# Patient Record
Sex: Male | Born: 1955 | Race: White | Hispanic: No | Marital: Married | Smoking: Former smoker
Health system: Southern US, Community
[De-identification: ages and names within clinical notes are randomized; demographics above are authoritative.]

## PROBLEM LIST (undated history)

## (undated) DIAGNOSIS — B019 Varicella without complication: Secondary | ICD-10-CM

## (undated) DIAGNOSIS — M1990 Unspecified osteoarthritis, unspecified site: Secondary | ICD-10-CM

## (undated) DIAGNOSIS — J189 Pneumonia, unspecified organism: Secondary | ICD-10-CM

## (undated) DIAGNOSIS — B269 Mumps without complication: Secondary | ICD-10-CM

## (undated) DIAGNOSIS — C61 Malignant neoplasm of prostate: Secondary | ICD-10-CM

## (undated) DIAGNOSIS — B059 Measles without complication: Secondary | ICD-10-CM

## (undated) DIAGNOSIS — J301 Allergic rhinitis due to pollen: Secondary | ICD-10-CM

## (undated) DIAGNOSIS — F1021 Alcohol dependence, in remission: Secondary | ICD-10-CM

## (undated) DIAGNOSIS — K219 Gastro-esophageal reflux disease without esophagitis: Secondary | ICD-10-CM

## (undated) DIAGNOSIS — J45909 Unspecified asthma, uncomplicated: Secondary | ICD-10-CM

## (undated) HISTORY — DX: Allergic rhinitis due to pollen: J30.1

## (undated) HISTORY — DX: Varicella without complication: B01.9

## (undated) HISTORY — PX: OTHER SURGICAL HISTORY: SHX169

## (undated) HISTORY — PX: WISDOM TOOTH EXTRACTION: SHX21

## (undated) HISTORY — PX: TONSILLECTOMY: SUR1361

## (undated) HISTORY — DX: Alcohol dependence, in remission: F10.21

## (undated) HISTORY — DX: Unspecified asthma, uncomplicated: J45.909

## (undated) HISTORY — DX: Measles without complication: B05.9

## (undated) HISTORY — DX: Mumps without complication: B26.9

---
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
## 2013-01-28 ENCOUNTER — Encounter: Payer: Self-pay | Admitting: Physician Assistant

## 2013-01-28 ENCOUNTER — Ambulatory Visit (INDEPENDENT_AMBULATORY_CARE_PROVIDER_SITE_OTHER): Payer: Managed Care, Other (non HMO) | Admitting: Physician Assistant

## 2013-01-28 VITALS — BP 124/80 | HR 83 | Temp 98.2°F | Resp 16 | Ht 70.0 in | Wt 200.5 lb

## 2013-01-28 DIAGNOSIS — Z8669 Personal history of other diseases of the nervous system and sense organs: Secondary | ICD-10-CM

## 2013-01-28 DIAGNOSIS — Z0000 Encounter for general adult medical examination without abnormal findings: Secondary | ICD-10-CM

## 2013-01-28 DIAGNOSIS — J4520 Mild intermittent asthma, uncomplicated: Secondary | ICD-10-CM

## 2013-01-28 DIAGNOSIS — J45909 Unspecified asthma, uncomplicated: Secondary | ICD-10-CM | POA: Insufficient documentation

## 2013-01-28 LAB — CBC WITH DIFFERENTIAL/PLATELET
Basophils Absolute: 0 10*3/uL (ref 0.0–0.1)
Eosinophils Relative: 4 % (ref 0–5)
HCT: 40 % (ref 39.0–52.0)
Lymphocytes Relative: 38 % (ref 12–46)
Lymphs Abs: 2.4 10*3/uL (ref 0.7–4.0)
Monocytes Absolute: 0.6 10*3/uL (ref 0.1–1.0)
Monocytes Relative: 9 % (ref 3–12)
Neutro Abs: 3.1 10*3/uL (ref 1.7–7.7)
Platelets: 241 10*3/uL (ref 150–400)
RBC: 4.8 MIL/uL (ref 4.22–5.81)
RDW: 13.6 % (ref 11.5–15.5)
WBC: 6.3 10*3/uL (ref 4.0–10.5)

## 2013-01-28 LAB — HEPATIC FUNCTION PANEL
ALT: 20 U/L (ref 0–53)
AST: 34 U/L (ref 0–37)
Bilirubin, Direct: 0.2 mg/dL (ref 0.0–0.3)
Indirect Bilirubin: 0.8 mg/dL (ref 0.0–0.9)
Total Protein: 6.7 g/dL (ref 6.0–8.3)

## 2013-01-28 LAB — BASIC METABOLIC PANEL
Calcium: 9.7 mg/dL (ref 8.4–10.5)
Chloride: 105 mEq/L (ref 96–112)
Creat: 1 mg/dL (ref 0.50–1.35)
Potassium: 4.3 mEq/L (ref 3.5–5.3)

## 2013-01-28 LAB — HEMOGLOBIN A1C
Hgb A1c MFr Bld: 5.5 % (ref <5.7)
Mean Plasma Glucose: 111 mg/dL (ref <117)

## 2013-01-28 LAB — LIPID PANEL
Cholesterol: 191 mg/dL (ref 0–200)
LDL Cholesterol: 70 mg/dL (ref 0–99)
Total CHOL/HDL Ratio: 1.7 Ratio
VLDL: 8 mg/dL (ref 0–40)

## 2013-01-28 LAB — TSH: TSH: 2.355 u[IU]/mL (ref 0.350–4.500)

## 2013-01-28 MED ORDER — ALBUTEROL SULFATE HFA 108 (90 BASE) MCG/ACT IN AERS: 2.0000 | INHALATION_SPRAY | Freq: Four times a day (QID) | RESPIRATORY_TRACT | Status: DC | PRN

## 2013-01-28 NOTE — Progress Notes (Signed)
Patient ID: Derek Russell, male   DOB: Jun 15, 1955, 57 y.o.   MRN: [id number]  Patient presents to clinic today to establish care and for complete physical.  Acute Concerns: Patient expresses no acute concerns at today's visit.  Chronic Issues: (1) Carpal Tunnel Syndrome -- occasional flare-up for which he takes Naproxen.  Refuses surgery. (2) Asthma -- hx of mild, intermittent asthma reported by patient.  Had Rx for rescue inhaler in the past, but none currently.  Denies shortness of breath, cough or wheezing.   Health Maintenance: Dental -- up-to-date Vision -- up-to-date Colonoscopy -- 2007; no abnormal findings -- repeat in 2017 Immunizations -- up-to-date.  Declines flu shot.  Informed about Zostavax.  Past Medical History  Diagnosis Date  . Asthma   . History of alcoholism     Quit 2001  . Hay fever   . Chicken pox   . Measles   . Mumps     No current outpatient prescriptions on file prior to visit.   No current facility-administered medications on file prior to visit.    No Known Allergies  Family History  Problem Relation Age of Onset  . Alcoholism Father   . Arthritis Mother   . Arthritis Maternal Grandmother   . Alzheimer's disease Maternal Grandfather   . Lymphoma Maternal Aunt   . Lymphoma Maternal Uncle   . Healthy Sister     x2  . Healthy Son     x1    History   Social History  . Marital Status: Married    Spouse Name: N/A    Number of Children: N/A  . Years of Education: N/A   Social History Main Topics  . Smoking status: Former Smoker -- 0.50 packs/day for 5 years    Types: Cigarettes    Quit date: 01/28/1986  . Smokeless tobacco: Never Used  . Alcohol Use: No  . Drug Use: No  . Sexual Activity: Yes    Birth Control/ Protection: Pill   Other Topics Concern  . None   Social History Narrative  . None   Review of Systems  Constitutional: Negative for fever, chills, weight loss and malaise/fatigue.  HENT: Negative for ear  discharge, ear pain, hearing loss and tinnitus.   Eyes: Negative for blurred vision, double vision and photophobia.  Respiratory: Negative for cough, shortness of breath and wheezing.   Cardiovascular: Negative for chest pain and palpitations.  Gastrointestinal: Negative for heartburn, nausea, vomiting, abdominal pain, diarrhea, constipation, blood in stool and melena.  Genitourinary: Negative for dysuria, urgency, frequency, hematuria and flank pain.       Nocturia x 0-1  Musculoskeletal: Negative for myalgias.  Neurological: Negative for dizziness, seizures, loss of consciousness and headaches.  Endo/Heme/Allergies: Positive for environmental allergies.  Psychiatric/Behavioral: Negative for depression, suicidal ideas, hallucinations and substance abuse. The patient is not nervous/anxious and does not have insomnia.    Filed Vitals:   01/28/13 1435  BP: 124/80  Pulse: 83  Temp: 98.2 F (36.8 C)  Resp: 16    Physical Exam  Vitals reviewed. Constitutional: He is oriented to person, place, and time and well-developed, well-nourished, and in no distress.  HENT:  Head: Normocephalic and atraumatic.  Right Ear: External ear normal.  Left Ear: External ear normal.  Nose: Nose normal.  Mouth/Throat: Oropharynx is clear and moist. No oropharyngeal exudate.  Tympanic membranes within normal limits bilaterally.  Eyes: Conjunctivae and EOM are normal. Pupils are equal, round, and reactive to light. No scleral icterus.  Neck:  Normal range of motion. Neck supple.  Cardiovascular: Normal rate, regular rhythm, normal heart sounds and intact distal pulses.   Pulmonary/Chest: Effort normal and breath sounds normal. No respiratory distress. He has no wheezes. He has no rales. He exhibits no tenderness.  Abdominal: Soft. Bowel sounds are normal. He exhibits no distension and no mass. There is no tenderness. There is no rebound and no guarding.  Lymphadenopathy:    He has no cervical adenopathy.   Neurological: He is alert and oriented to person, place, and time. No cranial nerve deficit.  Skin: Skin is warm and dry. No rash noted.  Psychiatric: Affect normal.   Assessment/Plan: Hx of carpal tunnel syndrome Occasional flare-ups for which he uses Naproxen.  No current symptoms.  Encounter for preventive health examination Will obtain fasting labs.  Patient up-to-date on health maintenance.  Declines flu shot.  Will speak with insurance about price of Zostavax.  Asthma Rx Albuterol inhaler.

## 2013-01-28 NOTE — Assessment & Plan Note (Signed)
Will obtain fasting labs.  Patient up-to-date on health maintenance.  Declines flu shot.  Will speak with insurance about price of Zostavax.

## 2013-01-28 NOTE — Assessment & Plan Note (Signed)
Rx Albuterol inhaler.

## 2013-01-28 NOTE — Patient Instructions (Signed)
Please obtain labs.  I will call you with your results.  We will schedule follow-up if labs are abnormal.  Otherwise, return in 1 year for complete physical and sooner if/when you need [place].

## 2013-01-28 NOTE — Assessment & Plan Note (Signed)
Occasional flare-ups for which he uses Naproxen.  No current symptoms.

## 2013-01-29 LAB — URINALYSIS, ROUTINE W REFLEX MICROSCOPIC
Hgb urine dipstick: NEGATIVE
Ketones, ur: NEGATIVE mg/dL
Nitrite: NEGATIVE
Urobilinogen, UA: 0.2 mg/dL (ref 0.0–1.0)
pH: 6 (ref 5.0–8.0)

## 2013-01-29 LAB — PSA: PSA: 3.81 ng/mL (ref <=4.00)

## 2013-10-10 ENCOUNTER — Encounter (HOSPITAL_COMMUNITY): Payer: Self-pay | Admitting: Emergency Medicine

## 2013-10-10 ENCOUNTER — Emergency Department (HOSPITAL_COMMUNITY)
Admission: EM | Admit: 2013-10-10 | Discharge: 2013-10-10 | Disposition: A | Discharge status: Home / Self Care | Payer: Managed Care, Other (non HMO) | Attending: Emergency Medicine | Admitting: Emergency Medicine

## 2013-10-10 ENCOUNTER — Emergency Department (HOSPITAL_COMMUNITY): Payer: Managed Care, Other (non HMO)

## 2013-10-10 DIAGNOSIS — S46909A Unspecified injury of unspecified muscle, fascia and tendon at shoulder and upper arm level, unspecified arm, initial encounter: Secondary | ICD-10-CM | POA: Insufficient documentation

## 2013-10-10 DIAGNOSIS — Z79899 Other long term (current) drug therapy: Secondary | ICD-10-CM | POA: Insufficient documentation

## 2013-10-10 DIAGNOSIS — Z7982 Long term (current) use of aspirin: Secondary | ICD-10-CM | POA: Insufficient documentation

## 2013-10-10 DIAGNOSIS — F1021 Alcohol dependence, in remission: Secondary | ICD-10-CM | POA: Diagnosis not present

## 2013-10-10 DIAGNOSIS — IMO0002 Reserved for concepts with insufficient information to code with codable children: Secondary | ICD-10-CM | POA: Diagnosis not present

## 2013-10-10 DIAGNOSIS — J45909 Unspecified asthma, uncomplicated: Secondary | ICD-10-CM | POA: Insufficient documentation

## 2013-10-10 DIAGNOSIS — Z8619 Personal history of other infectious and parasitic diseases: Secondary | ICD-10-CM | POA: Diagnosis not present

## 2013-10-10 DIAGNOSIS — Y92410 Unspecified street and highway as the place of occurrence of the external cause: Secondary | ICD-10-CM | POA: Diagnosis not present

## 2013-10-10 DIAGNOSIS — S298XXA Other specified injuries of thorax, initial encounter: Secondary | ICD-10-CM | POA: Insufficient documentation

## 2013-10-10 DIAGNOSIS — Y9389 Activity, other specified: Secondary | ICD-10-CM | POA: Diagnosis not present

## 2013-10-10 DIAGNOSIS — S4980XA Other specified injuries of shoulder and upper arm, unspecified arm, initial encounter: Secondary | ICD-10-CM | POA: Diagnosis not present

## 2013-10-10 DIAGNOSIS — Z23 Encounter for immunization: Secondary | ICD-10-CM | POA: Diagnosis not present

## 2013-10-10 DIAGNOSIS — Z87891 Personal history of nicotine dependence: Secondary | ICD-10-CM | POA: Insufficient documentation

## 2013-10-10 MED ORDER — TETANUS-DIPHTH-ACELL PERTUSSIS 5-2.5-18.5 LF-MCG/0.5 IM SUSP
0.5000 mL | Freq: Once | INTRAMUSCULAR | Status: AC
Start: 2013-10-10 — End: 2013-10-10
  Administered 2013-10-10: 0.5 mL via INTRAMUSCULAR
  Filled 2013-10-10: qty 0.5

## 2013-10-10 MED ORDER — METHOCARBAMOL 500 MG PO TABS: 500.0000 mg | ORAL_TABLET | Freq: Two times a day (BID) | ORAL | Status: DC

## 2013-10-10 NOTE — ED Notes (Signed)
He ambulates and dresses himself without difficulty.

## 2013-10-10 NOTE — ED Notes (Signed)
Patient transported to X-ray 
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
## 2013-10-10 NOTE — ED Provider Notes (Signed)
Medical screening examination/treatment/procedure(s) were performed by non-physician practitioner and as supervising physician I was immediately available for consultation/collaboration.   EKG Interpretation None       Kalman Drape, MD 10/10/13 1411
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
## 2013-10-11 ENCOUNTER — Emergency Department (HOSPITAL_COMMUNITY)
Admission: EM | Admit: 2013-10-11 | Discharge: 2013-10-11 | Disposition: A | Discharge status: Home / Self Care | Payer: Managed Care, Other (non HMO) | Attending: Emergency Medicine | Admitting: Emergency Medicine

## 2013-10-11 ENCOUNTER — Encounter (HOSPITAL_COMMUNITY): Payer: Self-pay | Admitting: Emergency Medicine

## 2013-10-11 DIAGNOSIS — B019 Varicella without complication: Secondary | ICD-10-CM | POA: Insufficient documentation

## 2013-10-11 DIAGNOSIS — Z87891 Personal history of nicotine dependence: Secondary | ICD-10-CM | POA: Insufficient documentation

## 2013-10-11 DIAGNOSIS — F1021 Alcohol dependence, in remission: Secondary | ICD-10-CM | POA: Insufficient documentation

## 2013-10-11 DIAGNOSIS — J45909 Unspecified asthma, uncomplicated: Secondary | ICD-10-CM | POA: Insufficient documentation

## 2013-10-11 DIAGNOSIS — S20212D Contusion of left front wall of thorax, subsequent encounter: Secondary | ICD-10-CM

## 2013-10-11 DIAGNOSIS — Z7982 Long term (current) use of aspirin: Secondary | ICD-10-CM | POA: Insufficient documentation

## 2013-10-11 DIAGNOSIS — B059 Measles without complication: Secondary | ICD-10-CM | POA: Insufficient documentation

## 2013-10-11 DIAGNOSIS — B269 Mumps without complication: Secondary | ICD-10-CM | POA: Insufficient documentation

## 2013-10-11 DIAGNOSIS — Z79899 Other long term (current) drug therapy: Secondary | ICD-10-CM | POA: Insufficient documentation

## 2013-10-11 DIAGNOSIS — S20219A Contusion of unspecified front wall of thorax, initial encounter: Secondary | ICD-10-CM | POA: Insufficient documentation

## 2013-10-11 DIAGNOSIS — J309 Allergic rhinitis, unspecified: Secondary | ICD-10-CM | POA: Insufficient documentation

## 2013-10-11 MED ORDER — HYDROCODONE-ACETAMINOPHEN 5-325 MG PO TABS: 1.0000 | ORAL_TABLET | Freq: Four times a day (QID) | ORAL | Status: DC | PRN

## 2013-10-11 NOTE — Discharge Instructions (Signed)
Return here as needed.  Followup with your primary care doctor use ice and heat to the area that is sore
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
## 2013-10-11 NOTE — ED Provider Notes (Signed)
Medical screening examination/treatment/procedure(s) were performed by non-physician practitioner and as supervising physician I was immediately available for consultation/collaboration.   EKG Interpretation None       Virgel Manifold, MD 10/11/13 1011
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
## 2013-10-15 ENCOUNTER — Telehealth (HOSPITAL_BASED_OUTPATIENT_CLINIC_OR_DEPARTMENT_OTHER): Payer: Self-pay | Admitting: Emergency Medicine

## 2013-10-25 ENCOUNTER — Ambulatory Visit (INDEPENDENT_AMBULATORY_CARE_PROVIDER_SITE_OTHER): Payer: Managed Care, Other (non HMO) | Admitting: Physician Assistant

## 2013-10-25 ENCOUNTER — Encounter: Payer: Self-pay | Admitting: Physician Assistant

## 2013-10-25 DIAGNOSIS — S20219A Contusion of unspecified front wall of thorax, initial encounter: Secondary | ICD-10-CM

## 2013-10-25 LAB — CBC
HCT: 40.4 % (ref 39.0–52.0)
HEMOGLOBIN: 13.8 g/dL (ref 13.0–17.0)
MCHC: 34.2 g/dL (ref 30.0–36.0)
MCV: 89.6 fl (ref 78.0–100.0)
PLATELETS: 260 10*3/uL (ref 150.0–400.0)
RBC: 4.51 Mil/uL (ref 4.22–5.81)
RDW: 12.9 % (ref 11.5–15.5)
WBC: 7.3 10*3/uL (ref 4.0–10.5)

## 2013-10-25 LAB — COMPREHENSIVE METABOLIC PANEL
ALK PHOS: 70 U/L (ref 39–117)
ALT: 18 U/L (ref 0–53)
AST: 29 U/L (ref 0–37)
Albumin: 4.1 g/dL (ref 3.5–5.2)
BUN: 20 mg/dL (ref 6–23)
CO2: 30 mEq/L (ref 19–32)
CREATININE: 0.9 mg/dL (ref 0.4–1.5)
Calcium: 9.3 mg/dL (ref 8.4–10.5)
Chloride: 103 mEq/L (ref 96–112)
GFR: 87.5 mL/min (ref >60.00)
Glucose, Bld: 101 mg/dL — ABNORMAL HIGH (ref 70–99)
Potassium: 3.7 mEq/L (ref 3.5–5.1)
Sodium: 139 mEq/L (ref 135–145)
Total Bilirubin: 0.8 mg/dL (ref 0.2–1.2)
Total Protein: 6.7 g/dL (ref 6.0–8.3)

## 2013-10-25 NOTE — Patient Instructions (Signed)
Please obtain labs.  I will call you with your results.  Continue over-the-counter pain medications as needed.  You will be contacted by our [facility] office to schedule your CT scan.  Keep your cell phone on.  If you develop any lightheadedness or dizziness, please proceed to the ER.
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
## 2013-10-25 NOTE — Progress Notes (Signed)
Pre visit review using our clinic review tool, if applicable. No additional management support is needed unless otherwise documented below in the visit note/SLS  

## 2013-10-26 ENCOUNTER — Ambulatory Visit (HOSPITAL_BASED_OUTPATIENT_CLINIC_OR_DEPARTMENT_OTHER): Payer: Managed Care, Other (non HMO)

## 2014-02-02 ENCOUNTER — Telehealth: Payer: Self-pay | Admitting: Physician Assistant

## 2014-02-02 DIAGNOSIS — Z1211 Encounter for screening for malignant neoplasm of colon: Secondary | ICD-10-CM

## 2014-02-02 NOTE — Telephone Encounter (Signed)
Referral placed.  Will be taken care of.

## 2014-02-02 NOTE — Telephone Encounter (Signed)
Pt states it is time for him to get a colonoscopy and he is needing a referral, pt has [place]. Pt perfer someone in [place] if possible.

## 2014-09-09 ENCOUNTER — Ambulatory Visit (INDEPENDENT_AMBULATORY_CARE_PROVIDER_SITE_OTHER): Payer: Managed Care, Other (non HMO) | Admitting: Physician Assistant

## 2014-09-09 ENCOUNTER — Encounter: Payer: Self-pay | Admitting: Physician Assistant

## 2014-09-09 VITALS — BP 136/82 | HR 88 | Temp 97.6°F | Resp 16 | Ht 70.0 in | Wt 188.2 lb

## 2014-09-09 DIAGNOSIS — J019 Acute sinusitis, unspecified: Secondary | ICD-10-CM

## 2014-09-09 DIAGNOSIS — B9689 Other specified bacterial agents as the cause of diseases classified elsewhere: Secondary | ICD-10-CM | POA: Insufficient documentation

## 2014-09-09 MED ORDER — DOXYCYCLINE HYCLATE 100 MG PO CAPS: 100.0000 mg | ORAL_CAPSULE | Freq: Two times a day (BID) | ORAL | Status: DC

## 2014-09-09 NOTE — Assessment & Plan Note (Signed)
Rx Doxycycline.  Increase fluids.  Rest.  Saline nasal spray.  Probiotic.  Mucinex as directed.  Humidifier in bedroom.  Call or return to clinic if symptoms are not improving.  

## 2014-09-09 NOTE — Patient Instructions (Signed)
Please take antibiotic as directed.  Increase fluid intake.  Use Saline nasal spray.  Take a daily multivitamin. Use Mucinex-D for congestion.  Place a humidifier in the bedroom.  Please call or return clinic if symptoms are not improving.  Sinusitis Sinusitis is redness, soreness, and swelling (inflammation) of the paranasal sinuses. Paranasal sinuses are air pockets within the bones of your face (beneath the eyes, the middle of the forehead, or above the eyes). In healthy paranasal sinuses, mucus is able to drain out, and air is able to circulate through them by way of your nose. However, when your paranasal sinuses are inflamed, mucus and air can become trapped. This can allow bacteria and other germs to grow and cause infection. Sinusitis can develop quickly and last only a short time (acute) or continue over a long period (chronic). Sinusitis that lasts for more than 12 weeks is considered chronic.  CAUSES  Causes of sinusitis include:  Allergies.  Structural abnormalities, such as displacement of the cartilage that separates your nostrils (deviated septum), which can decrease the air flow through your nose and sinuses and affect sinus drainage.  Functional abnormalities, such as when the small hairs (cilia) that line your sinuses and help remove mucus do not work properly or are not present. SYMPTOMS  Symptoms of acute and chronic sinusitis are the same. The primary symptoms are pain and pressure around the affected sinuses. Other symptoms include:  Upper toothache.  Earache.  Headache.  Bad breath.  Decreased sense of smell and taste.  A cough, which worsens when you are lying flat.  Fatigue.  Fever.  Thick drainage from your nose, which often is green and may contain pus (purulent).  Swelling and warmth over the affected sinuses. DIAGNOSIS  Your caregiver will perform a physical exam. During the exam, your caregiver may:  Look in your nose for signs of abnormal growths  in your nostrils (nasal polyps).  Tap over the affected sinus to check for signs of infection.  View the inside of your sinuses (endoscopy) with a special imaging device with a light attached (endoscope), which is inserted into your sinuses. If your caregiver suspects that you have chronic sinusitis, one or more of the following tests may be recommended:  Allergy tests.  Nasal culture A sample of mucus is taken from your nose and sent to a lab and screened for bacteria.  Nasal cytology A sample of mucus is taken from your nose and examined by your caregiver to determine if your sinusitis is related to an allergy. TREATMENT  Most cases of acute sinusitis are related to a viral infection and will resolve on their own within 10 days. Sometimes medicines are prescribed to help relieve symptoms (pain medicine, decongestants, nasal steroid sprays, or saline sprays).  However, for sinusitis related to a bacterial infection, your caregiver will prescribe antibiotic medicines. These are medicines that will help kill the bacteria causing the infection.  Rarely, sinusitis is caused by a fungal infection. In theses cases, your caregiver will prescribe antifungal medicine. For some cases of chronic sinusitis, surgery is needed. Generally, these are cases in which sinusitis recurs more than 3 times per year, despite other treatments. HOME CARE INSTRUCTIONS   Drink plenty of water. Water helps thin the mucus so your sinuses can drain more easily.  Use a humidifier.  Inhale steam 3 to 4 times a day (for example, sit in the bathroom with the shower running).  Apply a warm, moist washcloth to your face 3 to  4 times a day, or as directed by your caregiver.  Use saline nasal sprays to help moisten and clean your sinuses.  Take over-the-counter or prescription medicines for pain, discomfort, or fever only as directed by your caregiver. SEEK IMMEDIATE MEDICAL CARE IF:  You have increasing pain or severe  headaches.  You have nausea, vomiting, or drowsiness.  You have swelling around your face.  You have vision problems.  You have a stiff neck.  You have difficulty breathing. MAKE SURE YOU:   Understand these instructions.  Will watch your condition.  Will get help right away if you are not doing well or get worse. Document Released: 03/18/2005 Document Revised: 06/10/2011 Document Reviewed: 04/02/2011 [facility] Patient Information 2014 [place], [place].

## 2014-09-09 NOTE — Progress Notes (Signed)
Patient presents to clinic today c/o 1 month of sinus pressure, sinus pain, ear pressure, PND with intermittent sore throat, and cough that is now productive of yellow-brown sputum.  Patient endorses mild fatigue and chest congestion.  Denies fevers, chest pain or shortness of breath. Denies recent travel or sick contact.  Past Medical History  Diagnosis Date  . Asthma   . History of alcoholism     Quit 2001  . Hay fever   . Chicken pox   . Measles   . Mumps     Current Outpatient Prescriptions on File Prior to Visit  Medication Sig Dispense Refill  . aspirin 81 MG tablet Take 162 mg by mouth every morning.     . B Complex-C (B-COMPLEX WITH VITAMIN C) tablet Take 1 tablet by mouth every morning.     . Multiple Vitamins-Minerals (ANTIOXIDANT FORMULA) TABS Take 1 each by mouth daily.    . naproxen sodium (ANAPROX) 220 MG tablet Take 220 mg by mouth 2 (two) times daily as needed.      No current facility-administered medications on file prior to visit.    No Known Allergies  Family History  Problem Relation Age of Onset  . Alcoholism Father   . Arthritis Mother   . Arthritis Maternal Grandmother   . Alzheimer's disease Maternal Grandfather   . Lymphoma Maternal Aunt   . Lymphoma Maternal Uncle   . Healthy Sister     x2  . Healthy Son     x1    History   Social History  . Marital Status: Married    Spouse Name: N/A  . Number of Children: N/A  . Years of Education: N/A   Social History Main Topics  . Smoking status: Former Smoker -- 0.50 packs/day for 5 years    Types: Cigarettes    Quit date: 01/28/1986  . Smokeless tobacco: Never Used  . Alcohol Use: No  . Drug Use: No  . Sexual Activity: Yes    Birth Control/ Protection: Pill   Other Topics Concern  . None   Social History Narrative   Review of Systems - See HPI.  All other ROS are negative.  BP 136/82 mmHg  Pulse 88  Temp(Src) 97.6 F (36.4 C) (Oral)  Resp 16  Ht 5\' 10"  (1.778 m)  Wt 188 lb 4  oz (85.39 kg)  BMI 27.01 kg/m2  SpO2 100%  Physical Exam  Constitutional: He is oriented to person, place, and time and well-developed, well-nourished, and in no distress.  HENT:  Head: Normocephalic and atraumatic.  Right Ear: External ear normal.  Left Ear: External ear normal.  Nose: Mucosal edema and rhinorrhea present. Right sinus exhibits frontal sinus tenderness. Left sinus exhibits frontal sinus tenderness.  Mouth/Throat: Uvula is midline, oropharynx is clear and moist and mucous membranes are normal. No oropharyngeal exudate.  Eyes: Conjunctivae are normal. Pupils are equal, round, and reactive to light.  Neck: Neck supple.  Cardiovascular: Normal rate, regular rhythm, normal heart sounds and intact distal pulses.   Pulmonary/Chest: Effort normal and breath sounds normal. No respiratory distress. He has no wheezes. He has no rales. He exhibits no tenderness.  Lymphadenopathy:    He has no cervical adenopathy.  Neurological: He is alert and oriented to person, place, and time.  Skin: Skin is warm and dry. No rash noted.  Psychiatric: Affect normal.  Vitals reviewed.  Assessment/Plan: Acute bacterial sinusitis Rx Doxycycline.  Increase fluids.  Rest.  Saline nasal spray.  Probiotic.  Mucinex as directed.  Humidifier in bedroom.  Call or return to clinic if symptoms are not improving.

## 2014-09-09 NOTE — Progress Notes (Signed)
Pre visit review using our clinic review tool, if applicable. No additional management support is needed unless otherwise documented below in the visit note/SLS  

## 2014-09-20 ENCOUNTER — Telehealth: Payer: Self-pay | Admitting: *Deleted

## 2014-09-20 NOTE — Telephone Encounter (Signed)
ROI received via fax from Rachel. Forwarded to [place] to scan/email to medical records. JG/CMA

## 2015-04-02 HISTORY — PX: SHOULDER SURGERY: SHX246

## 2016-05-28 ENCOUNTER — Telehealth: Payer: Self-pay | Admitting: Medical Oncology

## 2016-05-28 ENCOUNTER — Encounter: Payer: Self-pay | Admitting: Medical Oncology

## 2016-05-28 NOTE — Progress Notes (Signed)
Dreama-wife left a voicemail stating that her husband Derek Russell would like to get an appointment in the Prostate [place] for a 2nd opinion. She asked I called her husband.

## 2016-05-28 NOTE — Progress Notes (Signed)
I left a message with Derek Russell to inform him I received a message from his wife regarding an appointment for a 2nd opinion. I introduced myself and left my office number and requested he call me back so I could discuss the Prostate [facility].

## 2016-05-28 NOTE — Progress Notes (Signed)
I spoke with Derek Russell and discussed the Prostate [place]. He is very interested and would like for set up an appointment. He was seen at [facility] and had a prostate biopsy by Dr. Dion Body. I verified his address and will mail him a packet of information. I will also e-mail him a release of information form.

## 2016-06-03 ENCOUNTER — Encounter: Payer: Self-pay | Admitting: Genetic Counselor

## 2016-06-03 ENCOUNTER — Telehealth: Payer: Self-pay | Admitting: Medical Oncology

## 2016-06-03 NOTE — Progress Notes (Signed)
Called [facility]. [facility] to inquire about pathology slides. I faxed a release of information to [phone]

## 2016-06-03 NOTE — Progress Notes (Signed)
I called pt to introduce myself as the Prostate Nurse Navigator and the Coordinator of the Prostate [place].  1. I gave him an appointment for March 09,2018 arriving at 7:45am/   2. I discussed the format of the clinic and the physicians he will be seeing that day.  3. I discussed where the clinic is located and how to contact me.  4. I confirmed his address and informed him I would be mailing a packet of information and forms to be completed. I asked him to bring them with him the day of his appointment.   He voiced understanding of the above. I asked him to call me if he has any questions or concerns regarding his appointments or the forms he needs to complete.

## 2016-06-03 NOTE — Progress Notes (Signed)
Derek Russell has not received the packet of information that I mailed him last Thursday. He asked to clarify the date, time and address for the conference. I explained that the packet went thru hospital mail room and it takes a few extra days. All questions were answered.

## 2016-06-03 NOTE — Progress Notes (Signed)
I spoke with [facility] medical records and faxed a release of information to obtain records from Dr. Wille Glaser Hudson's office visit.

## 2016-06-04 ENCOUNTER — Telehealth: Payer: Self-pay | Admitting: Medical Oncology

## 2016-06-04 NOTE — Progress Notes (Signed)
I called [facility] records to inquire about the status of my request. I spoke with Shirlean Mylar and she states that the original release of information 3/01 had been scanned. She will place a request to have records faxed today. I explained he is scheduled for clinic Friday and if I do not receive the patient will have to be rescheduled.

## 2016-06-06 ENCOUNTER — Telehealth: Payer: Self-pay | Admitting: Medical Oncology

## 2016-06-06 ENCOUNTER — Encounter: Payer: Self-pay | Admitting: Radiation Oncology

## 2016-06-06 ENCOUNTER — Encounter: Payer: Self-pay | Admitting: Medical Oncology

## 2016-06-06 NOTE — Progress Notes (Signed)
Received a call from [facility] pathology to notify me that the pathology slides have not arrived from [facility] for prostate [facility] 06/07/16. I will follow up

## 2016-06-06 NOTE — Progress Notes (Signed)
I had called Rhonda with tracking number and she followed up with [employer]. The slides were not shipped on Monday but yesterday. They should arrive at [facility] today around 1 pm. She will keep me updated.

## 2016-06-06 NOTE — Progress Notes (Signed)
I called [facility] pathology department to check on status of prostate pathology slides. I was informed that they were sent by [employer] on Monday. I was given the tracking number and will follow up with [employer].

## 2016-06-06 NOTE — Progress Notes (Signed)
GU Location of Tumor / Histology: prostatic adenocarcinoma  If Prostate Cancer, Gleason Score is (3 + 3) and PSA is (7.5)  Melanee Spry previously had his PSA checked in 2014. PSA in 2014 was 3.8. Patient didn't have PSA check again until 12/2015 and it proved to be 7.5  Biopsies of prostate (if applicable) revealed:    Past/Anticipated interventions by urology, if any: biopsy, referral for a second opinion  Past/Anticipated interventions by medical oncology, if any: no  Weight changes, if any: no  Bowel/Bladder complaints, if any: mild ED and nocturia   Nausea/Vomiting, if any: no  Pain issues, if any:  no  SAFETY ISSUES:  Prior radiation? no  Pacemaker/ICD? no  Possible current pregnancy? no  Is the patient on methotrexate? no  Current Complaints / other details:  61 year old male.Married to, [place]. 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 One son, Vonna Kotyk, who lives in [place].   Had a colonoscopy in 2017

## 2016-06-06 NOTE — Progress Notes (Signed)
Confirmed appointment for Prostate [facility] 06/07/16 arriving at 7:45am. I reminded him to bring his completed medical forms. I also asked him to bring his insurance card and to update his address when he registers. He voiced understanding.

## 2016-06-06 NOTE — Progress Notes (Signed)
Notified by Suanne Marker [facility], prostate biopsy slides from [place] arrived.

## 2016-06-07 ENCOUNTER — Encounter: Payer: Self-pay | Admitting: General Practice

## 2016-06-07 ENCOUNTER — Encounter: Payer: Self-pay | Admitting: Medical Oncology

## 2016-06-07 ENCOUNTER — Ambulatory Visit (HOSPITAL_BASED_OUTPATIENT_CLINIC_OR_DEPARTMENT_OTHER): Payer: Managed Care, Other (non HMO) | Admitting: Oncology

## 2016-06-07 ENCOUNTER — Ambulatory Visit
Admission: RE | Admit: 2016-06-07 | Discharge: 2016-06-07 | Disposition: A | Discharge status: Home / Self Care | Payer: Managed Care, Other (non HMO) | Source: Ambulatory Visit | Attending: Radiation Oncology | Admitting: Radiation Oncology

## 2016-06-07 ENCOUNTER — Encounter: Payer: Self-pay | Admitting: Radiation Oncology

## 2016-06-07 DIAGNOSIS — Z7982 Long term (current) use of aspirin: Secondary | ICD-10-CM | POA: Insufficient documentation

## 2016-06-07 DIAGNOSIS — Z807 Family history of other malignant neoplasms of lymphoid, hematopoietic and related tissues: Secondary | ICD-10-CM | POA: Diagnosis not present

## 2016-06-07 DIAGNOSIS — Z8261 Family history of arthritis: Secondary | ICD-10-CM | POA: Diagnosis not present

## 2016-06-07 DIAGNOSIS — Z79899 Other long term (current) drug therapy: Secondary | ICD-10-CM | POA: Insufficient documentation

## 2016-06-07 DIAGNOSIS — Z87891 Personal history of nicotine dependence: Secondary | ICD-10-CM | POA: Insufficient documentation

## 2016-06-07 DIAGNOSIS — Z808 Family history of malignant neoplasm of other organs or systems: Secondary | ICD-10-CM | POA: Diagnosis not present

## 2016-06-07 DIAGNOSIS — C61 Malignant neoplasm of prostate: Secondary | ICD-10-CM | POA: Insufficient documentation

## 2016-06-07 HISTORY — DX: Malignant neoplasm of prostate: C61

## 2016-06-07 NOTE — Progress Notes (Signed)
[place] Psychosocial Distress Screening Spiritual Care  Met with Derek Russell and [name] in [facility] to introduce [place] team/resources, reviewing distress screen per protocol.  The patient scored a 4 on the Psychosocial Distress Thermometer which indicates moderate distress. Also assessed for distress and other psychosocial needs.   [facility] DISTRESS SCREENING 06/07/2016  Screening Type Initial Screening  Distress experienced in past week (1-10) 4  Practical problem type Work/school  Emotional problem type Adjusting to illness  Information Concerns Type Lack of info about treatment  Referral to support programs Yes   [name] report a spike in emotional distress from dx, as well as significant reduction via the kind and thorough care of all providers at [facility].  Normalized feelings, provided empathic listening, and introduced [place] team/programming.  Follow up needed: No. Per pt, no other needs at this time.  Couple has full packet of [place] resources and knows how to reach the team. Please also page if immediate needs arise.  Thank you.   [place], [place], [facility] Pager [phone] Voicemail [phone]

## 2016-06-07 NOTE — Consult Note (Signed)
[facility]     06/07/2016   --------------------------------------------------------------------------------   Melanee Spry  MRN: [id number]  PRIMARY CARE:    DOB: 05-01-1955, 61 year old Male  REFERRING:    SSN:   PROVIDER:  [name], M.D.    LOCATION:  [facility] - [phone]   --------------------------------------------------------------------------------   CC/HPI: CC: Prostate Cancer   PCP: [name], DO  Location of consult: [facility]  Primary urologist: Dr. Peterson Lombard   Mr. Strike is a 61 year old who was noted to have an elevated PSA of 7.2 prompting a TRUS biopsy of the prostate by Dr. Peterson Lombard on 04/19/16. This confirmed Gleason 3+3=6 adenocarcinoma of the prostate in 4 out of 12 biopsy cores.   Family history: None.   Imaging studies: None.   PMH: He no major medical cormobidities.  PSH: No abdominal surgeries.   TNM stage: cT1c Nx Mx  PSA: 7.2  Gleason score: 3+3=6  Biopsy (04/19/16 - reviewed here by Dr. Orene Desanctis): 4/12 cores positive  Left: Left base (5%, 30%)  Right: R base (80%, 80%)  Prostate volume: 38 cc  PSAD: 0.19   Nomogram  OC disease: 62%  EPE: 37%  SVI: 1%  LNI: 1%  PFS (5 year, 10 year): 94%,89%   Urinary function: IPSS is 5.  Erectile function: SHIM score is 24.     ALLERGIES: None   MEDICATIONS: Aspirin 81 mg tablet, chewable  Naproxen 1 PO Daily  Red Yeast Rice 1 PO Daily  Vitamin B     GU PSH: None   NON-GU PSH: Lung lobectomy Shoulder Surgery (Unspecified)    GU PMH: None   NON-GU PMH: None   FAMILY HISTORY: lymphoma - Aunt melanoma - Uncle Prostate Cancer - No Family History   SOCIAL HISTORY: Marital Status: Married Current Smoking Status: Patient has never smoked.   Tobacco Use Assessment Completed: Used Tobacco in last 30 days?     Notes: Works as Freight forwarder at [employer]:    GU Review  Male:   Patient denies frequent urination, hard to postpone urination, burning/ pain with urination, get up at night to urinate, leakage of urine, stream starts and stops, trouble starting your streams, and have to strain to urinate .  Gastrointestinal (Lower):   Patient denies diarrhea and constipation.  Gastrointestinal (Upper):   Patient denies vomiting and nausea.  Constitutional:   Patient denies fever, night sweats, weight loss, and fatigue.  Skin:   Patient denies skin rash/ lesion and itching.  Eyes:   Patient denies blurred vision and double vision.  Ears/ Nose/ Throat:   Patient denies sore throat and sinus problems.  Hematologic/Lymphatic:   Patient denies swollen glands and easy bruising.  Cardiovascular:   Patient denies leg swelling and chest pains.  Respiratory:   Patient denies cough and shortness of breath.  Endocrine:   Patient denies excessive thirst.  Musculoskeletal:   Patient denies back pain and joint pain.  Neurological:   Patient denies headaches and dizziness.  Psychologic:   Patient denies depression and anxiety.   VITAL SIGNS: None   MULTI-SYSTEM PHYSICAL EXAMINATION:    Constitutional: Well-nourished. No physical deformities. Normally developed. Good grooming.     PAST DATA REVIEWED:  Source Of History:  Patient  Lab Test Review:   PSA  Records Review:   Pathology Reports, Previous Patient Records   PROCEDURES: None   ASSESSMENT:  ICD-10 Details  1 GU:   Prostate Cancer - C61    PLAN:           Document Letter(s):  Created for Patient: Clinical Summary         Notes:   1. Prostate cancer: Mr. Etchison and his wife were counseled today regarding his prostate cancer and were seen in the multidisciplinary clinic for a second opinion regarding management/treatment options. They are very well-informed through the prior discussions with Dr. Ky Barban.   We discussed the fact that he does appear to have low risk prostate cancer but does not meet criteria  for NCCN very low risk prostate cancer.   The patient was counseled about the natural history of prostate cancer and the standard treatment options that are available for prostate cancer. It was explained to him how his age and life expectancy, clinical stage, Gleason score, and PSA affect his prognosis, the decision to proceed with additional staging studies, as well as how that information influences recommended treatment strategies. We discussed the roles for active surveillance, radiation therapy, surgical therapy, androgen deprivation, as well as ablative therapy options for the treatment of prostate cancer as appropriate to his individual cancer situation. We discussed the risks and benefits of these options with regard to their impact on cancer control and also in terms of potential adverse events, complications, and impact on quality of life particularly related to urinary and sexual function. The patient was encouraged to ask questions throughout the discussion today and all questions were answered to his stated satisfaction. In addition, the patient was provided with and/or directed to appropriate resources and literature for further education about prostate cancer and treatment options.   We reviewed options specifically including the pros and cons of active surveillance, surgical therapy, and radiation therapy. We discussed the option of proceeding with an MRI and subsequent MR/ultrasound fusion biopsy if a lesion on MRI is identified. We discussed the role for genomic testing on his biopsy tissue with the understanding that the definite role for this testing is yet to be fully defined. With regard to treatment, he understands that he would be an appropriate candidate for a robotic prostatectomy with regard to surgery or a radiation seed implantation with regard to radiation therapy versus intensity modulated radiation therapy as an alternative option.   He feels very well-informed. All questions  were answered to his stated satisfaction. I reassured him that he is in excellent hands with Dr. Ky Barban. He will further consider his options and feels well equipped to make an educated decision. It was a pleasure to meet Mr. Taff and his wife today. He is scheduled to see both Dr. Tammi Klippel and Dr. Alen Blew this morning as well for a full multidisciplinary consultation.    Cc: Dr. Dion Body  Dr. Zola Button  Dr. [name]         E & M CODE: I spent at least 60 minutes face to face with the patient, more than 50% of that time was spent on counseling and/or coordinating care.

## 2016-06-07 NOTE — Progress Notes (Signed)
Radiation Oncology         [phone] ________________________________  Initial Outpatient Consultation  Name: Derek Russell MRN: [id number]  Date: 06/07/2016  DOB: [birth year]  DT:[id number], [name], DO  [name], MD   REFERRING PHYSICIAN: [name], MD  DIAGNOSIS:   61 y.o. gentleman with Stage T2a adenocarcinoma of the prostate with Gleason Score of 3+3=6, and PSA of 7.5     ICD-9-CM ICD-10-CM   1. Malignant neoplasm of prostate ([place]) 185 C61    HISTORY OF PRESENT ILLNESS: Derek Russell is a 61 y.o. male with a diagnosis of prostate cancer. He was noted to have an elevated PSA of 7.5 by his primary care physician.  Prior PSA in 2014 was 3.8.  Accordingly, he was referred for evaluation in urology by Dr. Peterson Lombard on 01/2016,  digital rectal examination was performed at that time revealing questionable firmness on the left but non-definitive.  The patient proceeded to transrectal ultrasound with 12 biopsies of the prostate on 04/19/16.  The prostate volume measured 38 cc. Out of 12 core biopsies, 4 were positive.  The maximum Gleason score was 3+3=6, and this was seen in the right and left base of the gland.  The patient reviewed the biopsy results with his urologist and he has kindly been referred today in the multidisciplinary prostate clinic for discussion of potential radiation treatment options. The patient's wife was also present during the encounter.  PREVIOUS RADIATION THERAPY: No  PAST MEDICAL HISTORY:  Past Medical History:  Diagnosis Date  . Asthma   . Chicken pox   . Hay fever   . History of alcoholism ([place])    Quit 2001  . Measles   . Mumps   . Prostate cancer ([place])       PAST SURGICAL HISTORY: Past Surgical History:  Procedure Laterality Date  . LUNG REMOVAL, PARTIAL  1976   LLL  . SHOULDER SURGERY Left 2017  . TONSILLECTOMY    . WISDOM TOOTH EXTRACTION      FAMILY HISTORY:  Family History  Problem Relation Age of Onset  . Alcoholism Father    . Arthritis Mother   . Arthritis Maternal Grandmother   . Alzheimer's disease Maternal Grandfather   . Lymphoma Maternal Aunt   . Cancer Maternal Aunt     lymphoma  . Lymphoma Maternal Uncle   . Cancer Maternal Uncle     melanoma  . Healthy Sister     x2  . Healthy Son     x1    SOCIAL HISTORY:  Social History   Social History  . Marital status: Married    Spouse name: N/A  . Number of children: N/A  . Years of education: N/A   Occupational History  . Not on file.   Social History Main Topics  . Smoking status: Former Smoker    Packs/day: 0.50    Years: 7.00    Types: Cigarettes    Quit date: 01/29/1984  . Smokeless tobacco: Never Used  . Alcohol use 2.4 oz/week    4 Cans of beer per week  . Drug use: No  . Sexual activity: Yes    Birth control/ protection: Pill   Other Topics Concern  . Not on file   Social History Narrative  . 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  MEDICATIONS:  Current Outpatient Prescriptions  Medication Sig  Dispense Refill  . aspirin 81 MG tablet Take 162 mg by mouth every morning.     . B Complex-C (B-COMPLEX WITH VITAMIN C) tablet Take 1 tablet by mouth every morning.     . naproxen sodium (ANAPROX) 220 MG tablet Take 220 mg by mouth 2 (two) times daily as needed.     . NON FORMULARY Prostarex    . Red Yeast Rice Extract (RED YEAST RICE PO) Take by mouth.     No current facility-administered medications for this encounter.     REVIEW OF SYSTEMS:  On review of systems, the patient reports that he is doing well overall. He denies any chest pain, shortness of breath, cough, fevers, chills, night sweats, unintended weight changes. He denies any bowel disturbances, and denies abdominal pain, nausea or vomiting. He denies any new musculoskeletal or joint aches or pains. His IPSS was 5, indicating mild urinary symptoms. He  complains of mild ED and nocturia, but no other symptoms at this time. His SHIM score is 24. He is able to complete sexual activity with almost all attempts. A complete review of systems is obtained and is otherwise negative.    PHYSICAL EXAM:  Wt Readings from Last 3 Encounters:  06/07/16 192 lb 12.8 oz (87.5 kg)  09/09/14 188 lb 4 oz (85.4 kg)  10/25/13 189 lb 2 oz (85.8 kg)   Temp Readings from Last 3 Encounters:  09/09/14 97.6 F (36.4 C) (Oral)  10/25/13 98.4 F (36.9 C) (Oral)  10/11/13 97.8 F (36.6 C)   BP Readings from Last 3 Encounters:  06/07/16 (!) 142/94  09/09/14 136/82  10/25/13 111/75   Pulse Readings from Last 3 Encounters:  06/07/16 77  09/09/14 88  10/25/13 94   Pain Assessment Pain Score: 0-No pain/10  In general this is a well appearing Caucasian male in no acute distress. He is alert and oriented x4 and appropriate throughout the examination. HEENT reveals that the patient is normocephalic, atraumatic. EOMs are intact. PERRLA. Skin is intact without any evidence of gross lesions. Cardiovascular exam reveals a regular rate and rhythm, no clicks rubs or murmurs are auscultated. Chest is clear to auscultation bilaterally. Lymphatic assessment is performed and does not reveal any adenopathy in the cervical, supraclavicular, or axillary chains. Abdomen has active bowel sounds in all quadrants and is intact. The abdomen is soft, non tender, non distended. Lower extremities are negative for pretibial pitting edema, deep calf tenderness, cyanosis or clubbing.   KPS = 100  100 - Normal; no complaints; no evidence of disease. 90   - Able to carry on normal activity; minor signs or symptoms of disease. 80   - Normal activity with effort; some signs or symptoms of disease. 63   - Cares for self; unable to carry on normal activity or to do active work. 60   - Requires occasional assistance, but is able to care for most of his personal needs. 50   - Requires  considerable assistance and frequent medical care. 2   - Disabled; requires special care and assistance. 33   - Severely disabled; hospital admission is indicated although death not imminent. 26   - Very sick; hospital admission necessary; active supportive treatment necessary. 10   - Moribund; fatal processes progressing rapidly. 0     - Dead  Karnofsky DA, Abelmann WH, Craver LS and Burchenal [facility] [phone]) The use of the nitrogen mustards in the palliative treatment of carcinoma: with particular reference to bronchogenic carcinoma Cancer 1  [id number]  LABORATORY DATA:  Lab Results  Component Value Date   WBC 7.3 10/25/2013   HGB 13.8 10/25/2013   HCT 40.4 10/25/2013   MCV 89.6 10/25/2013   PLT 260.0 10/25/2013   Lab Results  Component Value Date   NA 139 10/25/2013   K 3.7 10/25/2013   CL 103 10/25/2013   CO2 30 10/25/2013   Lab Results  Component Value Date   ALT 18 10/25/2013   AST 29 10/25/2013   ALKPHOS 70 10/25/2013   BILITOT 0.8 10/25/2013     RADIOGRAPHY: No results found.    IMPRESSION/PLAN: 1. 61 y.o. gentleman with Stage T2a adenocarcinoma of the prostate with Gleason Score of 3+3=6, and PSA of 7.5  His T-Stage, Gleason's Score, and PSA put him into the low risk group.  Accordingly he is eligible for a variety of potential treatment options including active surveillance, prostatectomy, external beam radiation (IMRT), or prostate seed implant.  Today I reviewed the findings and workup thus far.  We discussed the natural history of prostate cancer.  We reviewed the the implications of T-stage, Gleason's Score, and PSA on decision-making and outcomes in prostate cancer. We discussed active surveillance would require close following with PSA checks, imaging, and possibly more prostate biopsies in the future. We discussed radiation treatment in the management of prostate cancer with regard to the logistics and delivery of external beam radiation treatment as well as the  logistics and delivery of prostate brachytherapy. We compared and contrasted each of these approaches and also compared these against prostatectomy.  After discussing the patient's options, the patient has decided to proceed with active surveillance. I am in agreement and he could be referred back to radiation oncology if and when appropriate. In the interim, the patient will be scheduled for an MRI of the prostate by urology to further observe the extent of disease.     [name], PA-C  &    [name], MD [place] Director and Director of Stereotactic Radiosurgery Direct Dial: [phone]  Fax: [phone] [email or web address]  Skype  [email or web address]   This document serves as a record of services personally performed by [name], PA-C and [name], MD. It was created on their behalf by Darcus Austin, a trained medical scribe. The creation of this record is based on the scribe's personal observations and the providers' statements to them. This document has been checked and approved by the attending provider.

## 2016-06-07 NOTE — Progress Notes (Signed)
                               Care Plan Summary  Name: Mr. Neema Fluegge DOB: 102-18-1957   Your Medical Team:   Urologist -  Dr. [name], [facility]  Radiation Oncologist - Dr. [name], [facility]   Medical Oncologist - Dr. Zola Button, [facility]  Recommendations: 1) Active surveillance  2) MRI  3) Fusion biopsy * These recommendations are based on information available as of today's consult.      Recommendations may change depending on the results of further tests or exams.  Next Steps: 1) Follow up with Dr. Ky Barban as scheduled  2) Call me, Cira Rue, RN if I can be of assistance in anyway.   When appointments need to be scheduled, you will be contacted by [facility] and/or [facility].  Questions?  Please do not hesitate to call Cira Rue, RN, BSN, OCN at [phone] any questions or concerns.  Shirlean Mylar is your Oncology Nurse Navigator and is available to assist you while you're receiving your medical care at [facility].   Pt given a packet with business cards of  multidisciplinary team members and a copy of the Fall Prevention information sheet.

## 2016-06-07 NOTE — Progress Notes (Signed)
Reason for Referral: Prostate cancer.   HPI: 61 year old gentleman native of [place] but currently resides in [place]. Gentleman in reasonably good health and shape without any significant past medical history. He did have a screening PSA in 2014 and at that time his PSA was 3.8. A repeat PSA in January 2018 was 7.5. At that time he was evaluated by Dr. Ky Barban from urology and a biopsy at that time showed prostate adenocarcinoma Gleason score 3+3 = 6 involving 80% of one core. Another left base core showed a Gleason score 3+3 = 6 with 25% core involvement. Given these findings, he was referred to the prostate cancer multidisciplinary clinic for a second opinion. Clinically he is asymptomatic at this time without any urinary symptoms. He denied any frequency, urgency or nocturia. He remains active and works full time.  He does not report any headaches, blurry vision, syncope or seizures. He is not report any fevers, chills or sweats. He does not report any cough, wheezing or hemoptysis. He does not report any nausea, vomiting or abdominal pain. He is not reporting frequency urgency or hesitancy. He is not report any skeletal complaints. The remaining review of systems unremarkable.   Past Medical History:  Diagnosis Date  . Asthma   . Chicken pox   . Hay fever   . History of alcoholism ([place])    Quit 2001  . Measles   . Mumps   . Prostate cancer [facility])   :  Past Surgical History:  Procedure Laterality Date  . LUNG REMOVAL, PARTIAL  1976   LLL  . SHOULDER SURGERY Left 2017  . TONSILLECTOMY    . WISDOM TOOTH EXTRACTION    :   Current Outpatient Prescriptions:  .  aspirin 81 MG tablet, Take 162 mg by mouth every morning. , Disp: , Rfl:  .  B Complex-C (B-COMPLEX WITH VITAMIN C) tablet, Take 1 tablet by mouth every morning. , Disp: , Rfl:  .  naproxen sodium (ANAPROX) 220 MG tablet, Take 220 mg by mouth 2 (two) times daily as needed. , Disp: , Rfl:  .  NON FORMULARY, Prostarex, Disp: ,  Rfl:  .  Red Yeast Rice Extract (RED YEAST RICE PO), Take by mouth., Disp: , Rfl: :  No Known Allergies:  Family History  Problem Relation Age of Onset  . Alcoholism Father   . Arthritis Mother   . Arthritis Maternal Grandmother   . Alzheimer's disease Maternal Grandfather   . Lymphoma Maternal Aunt   . Cancer Maternal Aunt     lymphoma  . Lymphoma Maternal Uncle   . Cancer Maternal Uncle     melanoma  . Healthy Sister     x2  . Healthy Son     x1  :  Social History   Social History  . Marital status: Married    Spouse name: N/A  . Number of children: N/A  . Years of education: N/A   Occupational History  . Not on file.   Social History Main Topics  . Smoking status: Former Smoker    Packs/day: 0.50    Years: 7.00    Types: Cigarettes    Quit date: 01/29/1984  . Smokeless tobacco: Never Used  . Alcohol use 2.4 oz/week    4 Cans of beer per week  . Drug use: No  . Sexual activity: Yes    Birth control/ protection: Pill   Other Topics Concern  . Not on file   Social History Narrative  .  No narrative on file  :  Pertinent items are noted in HPI.  Exam: ECOG 0  General appearance: alert and cooperative appeared without distress. Throat: No oral thrush or ulcers. Neck: no adenopathy Back: negative Resp: clear to auscultation bilaterally Cardio: regular rate and rhythm, S1, S2 normal, no murmur, click, rub or gallop GI: soft, non-tender; bowel sounds normal; no masses,  no organomegaly Extremities: extremities normal, atraumatic, no cyanosis or edema Lymph nodes: Cervical, supraclavicular, and axillary nodes normal.  CBC    Component Value Date/Time   WBC 7.3 10/25/2013 1502   RBC 4.51 10/25/2013 1502   HGB 13.8 10/25/2013 1502   HCT 40.4 10/25/2013 1502   PLT 260.0 10/25/2013 1502   MCV 89.6 10/25/2013 1502   MCH 29.4 01/28/2013 1522   MCHC 34.2 10/25/2013 1502   RDW 12.9 10/25/2013 1502   LYMPHSABS 2.4 01/28/2013 1522   MONOABS 0.6  01/28/2013 1522   EOSABS 0.2 01/28/2013 1522   BASOSABS 0.0 01/28/2013 1522     Chemistry      Component Value Date/Time   NA 139 10/25/2013 1502   K 3.7 10/25/2013 1502   CL 103 10/25/2013 1502   CO2 30 10/25/2013 1502   BUN 20 10/25/2013 1502   CREATININE 0.9 10/25/2013 1502   CREATININE 1.00 01/28/2013 1522      Component Value Date/Time   CALCIUM 9.3 10/25/2013 1502   ALKPHOS 70 10/25/2013 1502   AST 29 10/25/2013 1502   ALT 18 10/25/2013 1502   BILITOT 0.8 10/25/2013 1502       Assessment and Plan:   61 year old gentleman diagnosed with prostate cancer in January 2018. He presented with a PSA of 7.5 and a biopsy confirmed the presence of Gleason score 3+3 = 6 in 2 cores. One had 80% involvement the other core showed 25% involvement.  His case was discussed today the prostate cancer multidisciplinary clinic. His prostate biopsy was reviewed with pathology as a part of the multidisciplinary conference. Options of therapy were reviewed with the patient at this time. Given his low risk disease options of active surveillance still exist at this time. Although definitive treatment could also be considered. Definitive treatment would be in the form of primary surgical therapy versus definitive radiation therapy. I explained to him that active surveillance does not mean neglect or ignoring these findings. Active surveillance requires repeat biopsies in potentially imaging studies. He'll consider these options and make his decision in the near future. I see no need for any systemic therapy at this time. All his questions were answered today to his satisfaction.

## 2016-06-13 ENCOUNTER — Telehealth: Payer: Self-pay | Admitting: Medical Oncology

## 2016-06-13 NOTE — Progress Notes (Signed)
Spoke with Derek Russell to inform him that [name] is happy to continue his care after being seen in the Prostate [place]. He confirmed he would like to continue active surveillance with MRI and fusion biopsy. I emailed this information to [name].

## 2016-06-13 NOTE — Progress Notes (Signed)
Derek Russell was seen this morning in the Prostate [place]. He left a message stating how impressed he was with the clinic and the physicians that he saw today. He states that [name] really put him at ease and made him feel very comfortable. He would like to know if [name] would accept him as a patient. I will follow up with [name] and call Derek Russell with reply.

## 2016-07-01 ENCOUNTER — Telehealth: Payer: Self-pay | Admitting: Medical Oncology

## 2016-07-01 NOTE — Progress Notes (Signed)
Spoke with Derek Russell to inform him that [name] plans to order a MRI in June/July. He will call you with results and then schedule a biopsy. He voiced understanding.

## 2016-07-01 NOTE — Progress Notes (Signed)
Derek Russell called, left a message  stating that he has not heard from Dr. Lynne Logan office regarding a follow up appointment.

## 2016-09-09 ENCOUNTER — Other Ambulatory Visit: Payer: Self-pay | Admitting: Urology

## 2016-09-09 DIAGNOSIS — C61 Malignant neoplasm of prostate: Secondary | ICD-10-CM

## 2016-10-07 ENCOUNTER — Other Ambulatory Visit: Payer: Managed Care, Other (non HMO)

## 2017-01-16 ENCOUNTER — Encounter: Payer: Self-pay | Admitting: Radiation Oncology

## 2017-01-22 NOTE — Progress Notes (Signed)
GU Location of Tumor / Histology:  prostatic adenocarcinoma   FUN after MRI of the prostate by urology to further observe the extent of disease.  If Prostate Cancer, Gleason Score is ( 3+4=7  )and PSA is (8.38)01/07/2017 Derek Russell presented months ago with signs/symptoms of:   Biopsies of  (if applicable) revealed: 32/05/5571   04/18/2016   Past/Anticipated interventions by urology, if any:   Past/Anticipated interventions by medical oncology, if any:   06/07/2016 Dr. Alen Blew  Active surveillance requires repeat biopsies in potentially imaging studies. He'll consider these options and make his decision in the near future. I see no need for any systemic therapy at this time.   Weight changes, if any:   Bowel/Bladder complaints, if any:   Nausea/Vomiting, if any:  Pain issues, if any:    SAFETY ISSUES:  Prior radiation? No  Pacemaker/ICD? No  Possible current pregnancy? No  Is the patient on methotrexate? No  Current Complaints / other details:  Maternal Aunt had Lymphoma , Maternal uncle had Melanoma, and another Maternal Uncle had Lymphoma, 61 year old male.Married to, [place]. 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 One son, Vonna Kotyk, who lives in [place].

## 2017-01-30 ENCOUNTER — Ambulatory Visit
Admission: RE | Admit: 2017-01-30 | Discharge: 2017-01-30 | Disposition: A | Discharge status: Home / Self Care | Payer: Managed Care, Other (non HMO) | Source: Ambulatory Visit | Attending: Radiation Oncology | Admitting: Radiation Oncology

## 2017-01-30 ENCOUNTER — Ambulatory Visit
Admission: RE | Admit: 2017-01-30 | Discharge: 2017-01-30 | Disposition: A | Discharge status: Home / Self Care | Payer: Managed Care, Other (non HMO) | Source: Ambulatory Visit | Attending: Urology | Admitting: Urology

## 2017-01-30 ENCOUNTER — Encounter: Payer: Self-pay | Admitting: Urology

## 2017-01-30 ENCOUNTER — Other Ambulatory Visit: Payer: Self-pay | Admitting: Urology

## 2017-01-30 VITALS — BP 137/92 | HR 82 | Temp 98.1°F | Wt 197.0 lb

## 2017-01-30 DIAGNOSIS — Z811 Family history of alcohol abuse and dependence: Secondary | ICD-10-CM | POA: Diagnosis not present

## 2017-01-30 DIAGNOSIS — C61 Malignant neoplasm of prostate: Secondary | ICD-10-CM

## 2017-01-30 DIAGNOSIS — Z7982 Long term (current) use of aspirin: Secondary | ICD-10-CM | POA: Diagnosis not present

## 2017-01-30 DIAGNOSIS — Z902 Acquired absence of lung [part of]: Secondary | ICD-10-CM | POA: Diagnosis not present

## 2017-01-30 DIAGNOSIS — Z8261 Family history of arthritis: Secondary | ICD-10-CM | POA: Diagnosis not present

## 2017-01-30 DIAGNOSIS — F1021 Alcohol dependence, in remission: Secondary | ICD-10-CM | POA: Diagnosis not present

## 2017-01-30 DIAGNOSIS — Z87891 Personal history of nicotine dependence: Secondary | ICD-10-CM | POA: Insufficient documentation

## 2017-01-30 NOTE — Addendum Note (Signed)
Encounter addended by: [name], PA-C on: 01/30/2017  5:09 PM<BR>    Actions taken: LOS modified, Follow-up modified, Visit Navigator Flowsheet section accepted

## 2017-01-30 NOTE — Progress Notes (Signed)
Radiation Oncology         ([phone]) [phone] ________________________________  Initial Outpatient Consultation  Name: Derek Russell MRN: [id number]  Date: 01/30/2017  DOB: 1955-05-22  SE:[id number], Derek Pheasant, DO  [name], MD   REFERRING PHYSICIAN: [name], MD  DIAGNOSIS:   61 y.o. 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     ICD-10-CM   1. Malignant neoplasm of prostate ([place]) C61    HISTORY OF PRESENT ILLNESS: Derek Russell is a 61 y.o. male with a diagnosis of prostate cancer. He was noted to have an elevated PSA of 7.5 by his primary care physician.  Prior PSA in 2014 was 3.8.  Accordingly, he was referred for evaluation in urology by Dr. Peterson Lombard on 01/2016,  digital rectal examination was performed at that time revealing questionable firmness on the left but non-definitive.  The patient proceeded to transrectal ultrasound with 12 biopsies of the prostate on 04/19/16.  The prostate volume measured 38 cc. Out of 12 core biopsies, 4 were positive.  The maximum Gleason score was 3+3=6, and this was seen in the right and left base of the gland.  The patient reviewed the biopsy results with his urologist and was kindly referred to the multidisciplinary prostate clinic for discussion of potential radiation treatment options on 06/07/16. At that time, he elected to transfer his care to [name] and continue with active surveillance but agreed to proceed with prostate MRI to further assess the extent of his disease.  Unfortunately, his insurance denied approval for prostate MRI, so the patient elected to proceed with repeat TRUSPBx.  A repeat PSA was performed on 01/07/17 and was further elevated at 8.38.  He underwent TRUSPBx with [name] on 01/07/17.  The prostate volume measured 59.8 gm. Out of 26 core biopsies, 15 were positive.  The maximum Gleason score was 3+4=7, and this was seen in the right base, left base lateral and left  mid lateral gland.  After further review and discussion of these results with his urologist, he has ben kindly referred back to radiation oncology for consideration  The patient's wife was also present during the visit today.  PREVIOUS RADIATION THERAPY: No  PAST MEDICAL HISTORY:  Past Medical History:  Diagnosis Date  . Asthma   . Chicken pox   . Hay fever   . History of alcoholism ([place])    Quit 2001  . Measles   . Mumps   . Prostate cancer ([place])       PAST SURGICAL HISTORY: Past Surgical History:  Procedure Laterality Date  . LUNG REMOVAL, PARTIAL  1976   LLL  . SHOULDER SURGERY Left 2017  . TONSILLECTOMY    . WISDOM TOOTH EXTRACTION      FAMILY HISTORY:  Family History  Problem Relation Age of Onset  . Alcoholism Father   . Arthritis Mother   . Arthritis Maternal Grandmother   . Alzheimer's disease Maternal Grandfather   . Lymphoma Maternal Aunt   . Cancer Maternal Aunt        lymphoma  . Lymphoma Maternal Uncle   . Cancer Maternal Uncle        melanoma  . Healthy Sister        x2  . Healthy Son        x1    SOCIAL HISTORY:  Social History   Social History  . Marital status: Married    Spouse name: N/A  .  Number of children: N/A  . Years of education: N/A   Occupational History  . Not on file.   Social History Main Topics  . Smoking status: Former Smoker    Packs/day: 0.50    Years: 7.00    Types: Cigarettes    Quit date: 01/29/1984  . Smokeless tobacco: Never Used  . Alcohol use 2.4 oz/week    4 Cans of beer per week  . Drug use: No  . Sexual activity: Yes    Birth control/ protection: Pill   Other Topics Concern  . Not on file   Social History Narrative  . 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  MEDICATIONS:  Current Outpatient Prescriptions  Medication Sig Dispense Refill  . aspirin 81 MG tablet Take 162 mg by  mouth every morning.     . B Complex-C (B-COMPLEX WITH VITAMIN C) tablet Take 1 tablet by mouth every morning.     . naproxen sodium (ANAPROX) 220 MG tablet Take 220 mg by mouth 2 (two) times daily as needed.     . Red Yeast Rice Extract (RED YEAST RICE PO) Take by mouth.     No current facility-administered medications for this encounter.     REVIEW OF SYSTEMS:  On review of systems, the patient reports that he is doing well overall. He denies any chest pain, shortness of breath, cough, fevers, chills, night sweats, unintended weight changes. He denies any bowel disturbances, and denies abdominal pain, nausea or vomiting. He denies any new musculoskeletal or joint aches or pains. His IPSS was 5, indicating mild urinary symptoms. He complains of mild ED and nocturia, but no other symptoms at this time. His SHIM score is 24. He is able to complete sexual activity with almost all attempts. A complete review of systems is obtained and is otherwise negative.    PHYSICAL EXAM:  Wt Readings from Last 3 Encounters:  01/30/17 197 lb (89.4 kg)  06/07/16 192 lb 12.8 oz (87.5 kg)  09/09/14 188 lb 4 oz (85.4 kg)   Temp Readings from Last 3 Encounters:  01/30/17 98.1 F (36.7 C) (Oral)  09/09/14 97.6 F (36.4 C) (Oral)  10/25/13 98.4 F (36.9 C) (Oral)   BP Readings from Last 3 Encounters:  01/30/17 (!) 137/92  06/07/16 (!) 142/94  09/09/14 136/82   Pulse Readings from Last 3 Encounters:  01/30/17 82  06/07/16 77  09/09/14 88   Pain Assessment Pain Score: 0-No pain/10  In general this is a well appearing Caucasian male in no acute distress. He is alert and oriented x4 and appropriate throughout the examination. HEENT reveals that the patient is normocephalic, atraumatic. EOMs are intact. PERRLA. Skin is intact without any evidence of gross lesions. Cardiovascular exam reveals a regular rate and rhythm, no clicks rubs or murmurs are auscultated. Chest is clear to auscultation bilaterally.  Lymphatic assessment is performed and does not reveal any adenopathy in the cervical, supraclavicular, or axillary chains. Abdomen has active bowel sounds in all quadrants and is intact. The abdomen is soft, non tender, non distended. Lower extremities are negative for pretibial pitting edema, deep calf tenderness, cyanosis or clubbing.   KPS = 100  100 - Normal; no complaints; no evidence of disease. 90   - Able to carry on normal activity; minor signs or symptoms of disease. 80   - Normal activity with effort;  some signs or symptoms of disease. 30   - Cares for self; unable to carry on normal activity or to do active work. 60   - Requires occasional assistance, but is able to care for most of his personal needs. 50   - Requires considerable assistance and frequent medical care. 39   - Disabled; requires special care and assistance. 9   - Severely disabled; hospital admission is indicated although death not imminent. 4   - Very sick; hospital admission necessary; active supportive treatment necessary. 10   - Moribund; fatal processes progressing rapidly. 0     - Dead  Karnofsky DA, Abelmann [place], Craver LS and [facility] [phone]) The use of the nitrogen mustards in the palliative treatment of carcinoma: with particular reference to bronchogenic carcinoma Cancer 1 634-56  LABORATORY DATA:  Lab Results  Component Value Date   WBC 7.3 10/25/2013   HGB 13.8 10/25/2013   HCT 40.4 10/25/2013   MCV 89.6 10/25/2013   PLT 260.0 10/25/2013   Lab Results  Component Value Date   NA 139 10/25/2013   K 3.7 10/25/2013   CL 103 10/25/2013   CO2 30 10/25/2013   Lab Results  Component Value Date   ALT 18 10/25/2013   AST 29 10/25/2013   ALKPHOS 70 10/25/2013   BILITOT 0.8 10/25/2013     RADIOGRAPHY: No results found.    IMPRESSION/PLAN: 1. 61 y.o. 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  His T-Stage, Gleason's Score, and PSA  put him into the intermediate risk group.  Accordingly he is no longer a recommended option to continue active surveillance, but he is eligible for a variety of potential treatment options including prostatectomy, external beam radiation (IMRT), or prostate seed implant.  Today I reviewed the findings and workup thus far.  We discussed the natural history of prostate cancer.  We reviewed the the implications of T-stage, Gleason's Score, and PSA on decision-making and outcomes in prostate cancer. We discussed that active surveillance is no longer recommended as standard of care with his upgraded Gleason score of 3+4=7. We discussed radiation treatment in the management of prostate cancer with regard to the logistics and delivery of external beam radiation treatment as well as the logistics and delivery of prostate brachytherapy. We also discussed the role of SpaceOAR in reducing rectal toxicity from radiotherapy. Dr. Tammi Klippel recommends the use of SpaceOAR particularly in his case due to his large prostate size with a volume of 59.8 gm.  We compared and contrasted each of the radiotherapy options and also compared these against prostatectomy.  At the conclusion of our conversation, the patient is interested in moving forward with brachytherapy and use of SpaceOAR to reduce rectal toxicity from radiotherapy.  We will share our discussion with [name] and move forward with scheduling his CT [facility] planning appointment in the near future.  The patient met briefly with Romie Jumper in our office who will be working closely with him to coordinate OR scheduling and pre and post procedure appointments.  We will contact the pharmaceutical rep to ensure that [place] is available at the time of procedure.  He will have a prostate MRI following his post-seed CT SIM to confirm appropriate distribution of the [place].     [name], PA-C

## 2017-01-30 NOTE — Progress Notes (Signed)
GU Location of Tumor / Histology:  prostatic adenocarcinoma   FUN after MRI of the prostate by urology to further observe the extent of disease.  If Prostate Cancer, Gleason Score is ( 3+4=7  )and PSA is (8.38)01/07/2017 Derek Russell presented months ago with signs/symptoms of:   Biopsies of  (if applicable) revealed: 14/06/3152   04/18/2016   Past/Anticipated interventions by urology, if any:   Past/Anticipated interventions by medical oncology, if any:   06/07/2016 Dr. Alen Blew  Active surveillance requires repeat biopsies in potentially imaging studies. He'll consider these options and make his decision in the near future. I see no need for any systemic therapy at this time.   Weight changes, if any: Gained 2lbs   Bowel/Bladder complaints, if any: No   Nausea/Vomiting, if any: No  Pain issues, if any:    SAFETY ISSUES:  Prior radiation? No  Pacemaker/ICD? No  Possible current pregnancy? No  Is the patient on methotrexate? No  Current Complaints / other details:  Maternal Aunt had Lymphoma , Maternal uncle had Melanoma, and another Maternal Uncle had Lymphoma, 61 year old male.Married to, [place]. 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 One son, Vonna Kotyk, who lives in [place].   BP (!) 141/93 (BP Location: Left Arm, Patient Position: Sitting)   Pulse 82   Temp 98.1 F (36.7 C) (Oral)   Wt 197 lb (89.4 kg)   SpO2 99%   BMI 27.48 kg/m

## 2017-02-07 ENCOUNTER — Telehealth: Payer: Self-pay | Admitting: *Deleted

## 2017-02-07 NOTE — Telephone Encounter (Signed)
CALLED PATIENT TO INFORM OF PRE-SEED APPTS. FOR 03-07-17, LVM FOR A RETURN CALL

## 2017-02-14 ENCOUNTER — Telehealth: Payer: Self-pay | Admitting: *Deleted

## 2017-02-14 NOTE — Telephone Encounter (Signed)
Received request for Medical Records [place], [facility]; forwarded to [place] for email/scana/SLS 11/16

## 2017-03-06 ENCOUNTER — Telehealth: Payer: Self-pay | Admitting: *Deleted

## 2017-03-06 NOTE — Telephone Encounter (Signed)
CALLED PATIENT TO REMIND OF PRE-SEED PLANNING CT FOR 03-07-17, SPOKE WITH PATIENT AND HE IS AWARE OF THESE APPTS.

## 2017-03-07 ENCOUNTER — Ambulatory Visit
Admission: RE | Admit: 2017-03-07 | Discharge: 2017-03-07 | Disposition: A | Discharge status: Home / Self Care | Payer: Managed Care, Other (non HMO) | Source: Ambulatory Visit | Attending: Radiation Oncology | Admitting: Radiation Oncology

## 2017-03-07 DIAGNOSIS — C61 Malignant neoplasm of prostate: Secondary | ICD-10-CM

## 2017-03-07 NOTE — Progress Notes (Signed)
  Radiation Oncology         [phone]) [phone] ________________________________  Name: Derek Russell MRN: [id number]  Date: 03/07/2017  DOB: [birth year]  SIMULATION AND TREATMENT PLANNING NOTE PUBIC ARCH STUDY  HU:[id number], [name], DO  [name], MD  DIAGNOSIS: 61 y.o. gentleman with Stage T2a adenocarcinoma of the prostate with Gleason Score of 3+4 and PSA of 8.38    ICD-10-CM   1. Malignant neoplasm of prostate ([place]) C61     COMPLEX SIMULATION:  The patient presented today for evaluation for possible prostate seed implant. He was brought to the radiation planning suite and placed supine on the CT couch. A 3-dimensional image study set was obtained in upload to the planning computer. There, on each axial slice, I contoured the prostate gland. Then, using three-dimensional radiation planning tools I reconstructed the prostate in view of the structures from the transperineal needle pathway to assess for possible pubic arch interference. In doing so, I did not appreciate any pubic arch interference. Also, the patient's prostate volume was estimated based on the drawn structure. The volume was 42 cc.  Given the pubic arch appearance and prostate volume, patient remains a good candidate to proceed with prostate seed implant. Today, he freely provided informed written consent to proceed.    PLAN: The patient will undergo prostate seed implant.   ________________________________  Sheral Apley. Tammi Klippel, M.D.  This document serves as a record of services personally performed by [name], MD. It was created on his behalf by [name], a trained medical scribe. The creation of this record is based on the scribe's personal observations and the provider's statements to them. This document has been checked and approved by the attending provider.

## 2017-04-03 ENCOUNTER — Other Ambulatory Visit: Payer: Self-pay | Admitting: Urology

## 2017-04-03 ENCOUNTER — Telehealth: Payer: Self-pay | Admitting: *Deleted

## 2017-04-03 NOTE — Telephone Encounter (Signed)
Called patient to inform of implant date, spoke with patient and he is aware of this procedure °

## 2017-04-04 ENCOUNTER — Telehealth: Payer: Self-pay | Admitting: *Deleted

## 2017-04-04 NOTE — Telephone Encounter (Signed)
Called patient to inform of chest x-ray and EKG for 04-25-17 @ 11 am, spoke with patient and he is aware of this appt.

## 2017-04-25 ENCOUNTER — Ambulatory Visit (HOSPITAL_COMMUNITY)
Admission: RE | Admit: 2017-04-25 | Discharge: 2017-04-25 | Disposition: A | Discharge status: Home / Self Care | Payer: Managed Care, Other (non HMO) | Source: Ambulatory Visit | Attending: Urology | Admitting: Urology

## 2017-04-25 ENCOUNTER — Encounter (HOSPITAL_COMMUNITY)
Admission: RE | Admit: 2017-04-25 | Discharge: 2017-04-25 | Disposition: A | Discharge status: Home / Self Care | Payer: Managed Care, Other (non HMO) | Source: Ambulatory Visit | Attending: Urology | Admitting: Urology

## 2017-04-25 DIAGNOSIS — C61 Malignant neoplasm of prostate: Secondary | ICD-10-CM | POA: Diagnosis present

## 2017-04-25 DIAGNOSIS — Z01818 Encounter for other preprocedural examination: Secondary | ICD-10-CM | POA: Diagnosis not present

## 2017-05-15 ENCOUNTER — Encounter (HOSPITAL_BASED_OUTPATIENT_CLINIC_OR_DEPARTMENT_OTHER): Payer: Self-pay | Admitting: *Deleted

## 2017-05-15 ENCOUNTER — Other Ambulatory Visit: Payer: Self-pay

## 2017-05-15 NOTE — Progress Notes (Signed)
Npo after midnight food, clear liquids midnight until 700 am then npo. Meds: none, take fleets enema am of surgery before leaving home ekg and chest xray done 04-25-17 epic/chart Driver : wife dreama

## 2017-05-23 ENCOUNTER — Telehealth: Payer: Self-pay | Admitting: *Deleted

## 2017-05-23 NOTE — Telephone Encounter (Signed)
CALLED PATIENT TO REMIND OF LABS FOR 05-26-17 @ 1 PM (PT. TO REPORT TO [facility] ADMITTING ON 05/26/17 @ 12:45 PM). PATIENT AGREED TO APPT. DATE AND TIME.

## 2017-05-26 ENCOUNTER — Encounter (HOSPITAL_COMMUNITY)
Admission: RE | Admit: 2017-05-26 | Discharge: 2017-05-26 | Disposition: A | Discharge status: Home / Self Care | Payer: Managed Care, Other (non HMO) | Source: Ambulatory Visit | Attending: Urology | Admitting: Urology

## 2017-05-26 DIAGNOSIS — Z01818 Encounter for other preprocedural examination: Secondary | ICD-10-CM | POA: Insufficient documentation

## 2017-05-26 LAB — CBC
HEMATOCRIT: 43.5 % (ref 39.0–52.0)
HEMOGLOBIN: 14.9 g/dL (ref 13.0–17.0)
MCH: 31.4 pg (ref 26.0–34.0)
MCHC: 34.3 g/dL (ref 30.0–36.0)
MCV: 91.8 fL (ref 78.0–100.0)
Platelets: 235 10*3/uL (ref 150–400)
RBC: 4.74 MIL/uL (ref 4.22–5.81)
RDW: 12.6 % (ref 11.5–15.5)
WBC: 4.7 10*3/uL (ref 4.0–10.5)

## 2017-05-26 LAB — COMPREHENSIVE METABOLIC PANEL
ALK PHOS: 69 U/L (ref 38–126)
ALT: 21 U/L (ref 17–63)
AST: 32 U/L (ref 15–41)
Albumin: 4.6 g/dL (ref 3.5–5.0)
Anion gap: 11 (ref 5–15)
BUN: 15 mg/dL (ref 6–20)
CALCIUM: 9.2 mg/dL (ref 8.9–10.3)
CO2: 25 mmol/L (ref 22–32)
CREATININE: 0.87 mg/dL (ref 0.61–1.24)
Chloride: 106 mmol/L (ref 101–111)
Glucose, Bld: 89 mg/dL (ref 65–99)
Potassium: 4.5 mmol/L (ref 3.5–5.1)
Sodium: 142 mmol/L (ref 135–145)
Total Bilirubin: 1.1 mg/dL (ref 0.3–1.2)
Total Protein: 7.4 g/dL (ref 6.5–8.1)

## 2017-05-26 LAB — PROTIME-INR
INR: 0.96
PROTHROMBIN TIME: 12.7 s (ref 11.4–15.2)

## 2017-05-26 LAB — APTT: aPTT: 30 seconds (ref 24–36)

## 2017-05-29 NOTE — H&P (Signed)
CC/HPI: Prostate cancer     Mr. Derek Russell returns today for follow-up of his prostate cancer. He was noted to have an elevated PSA of 7.2 prompting a TRUS biopsy of the prostate by Dr. Peterson Lombard on 04/19/16. This confirmed Gleason 3+3=6 adenocarcinoma of the prostate in 4 out of 12 biopsy cores. He was evaluated in the multidisciplinary clinic and elected to transfer his care to [facility]. After discussing options for management, he elected to proceed with active surveillance. He subsequently proceeded with further evaluation was recommended to undergo an MRI of the prostate followed by a repeat more extensive biopsy. His insurance company denied his MRI. He therefore proceeded with a repeat biopsy on 01/07/17 they confirmed higher volume and upgraded disease. He was noted to have 15 out of 26 biopsy cores positive for malignancy and was noted to have Gleason 3+4 = 7 adenocarcinoma in multiple areas.     Initial diagnosis: January 2018   TNM stage: cT1c Nx Mx   PSA: 7.2   Gleason score: 3+3=6   Biopsy (04/19/16 - reviewed here by Dr. Orene Desanctis): 4/12 cores positive   Left: Left base (5%, 30%)   Right: R base (80%, 80%)   Prostate volume: 38 cc   PSAD: 0.19     Baseline urinary function: IPSS is 5.   Baseline erectile function: SHIM score is 24.     Surveillance:   Aug 2018: [employer] denied approval for prostate MRI (against my recommendations)   Ocr 2018: 15/26 biopsy cores positive. Multiple areas of Gleason 3+4 = 7 present.     He follows up today to discuss management/treatment options considering his upgraded disease and higher volume disease.        ALLERGIES: None     MEDICATIONS: Aspirin 81 mg tablet, chewable   Naproxen 1 PO Daily   Red Yeast Rice 1 PO Daily   Vitamin B        GU PSH: Prostate Needle Biopsy - 01/07/2017       NON-GU PSH: Lung lobectomy  Shoulder Surgery (Unspecified)  Surgical Pathology, Gross And Microscopic Examination For Prostate Needle - 01/07/2017        GU PMH: Prostate Cancer - 06/07/2016         PMH Notes:     1) Prostate cancer: He was noted to have an elevated PSA of 7.2 prompting a TRUS biopsy of the prostate by Dr. Peterson Lombard on 04/19/16. This confirmed Gleason 3+3=6 adenocarcinoma of the prostate in 4 out of 12 biopsy cores. He was evaluated in the multidisciplinary clinic and elected to transfer his care to [facility]. After discussing options for management, he elected to proceed with active surveillance.     Initial diagnosis: January 2018   TNM stage: cT1c Nx Mx   PSA: 7.2   Gleason score: 3+3=6   Biopsy (04/19/16 - reviewed here by Dr. Orene Desanctis): 4/12 cores positive   Left: Left base (5%, 30%)   Right: R base (80%, 80%)   Prostate volume: 38 cc   PSAD: 0.19     Baseline urinary function: IPSS is 5.   Baseline erectile function: SHIM score is 24.     Surveillance:   Aug 2018: [employer] denied approval for prostate MRI (against my recommendations)      NON-GU PMH: None     FAMILY HISTORY: lymphoma - Aunt  melanoma - Uncle  Prostate Cancer - No Family History     SOCIAL HISTORY: Marital Status: Married  Preferred Language: [place]; Ethnicity: Not Hispanic Or Latino; Race: White  Current Smoking Status: Patient has never smoked.     Tobacco Use Assessment Completed: Used Tobacco in last 30 days?       Notes: Works as Freight forwarder at [employer]:     GU Review Male:   Patient reports get up at night to urinate. Patient denies frequent urination, hard to postpone urination, burning/ pain with urination, leakage of urine, stream starts and stops, trouble starting your streams, and have to strain to urinate .   Gastrointestinal (Lower):   Patient denies diarrhea and constipation.   Gastrointestinal (Upper):   Patient denies nausea and vomiting.   Constitutional:   Patient denies fever, night sweats, weight loss, and fatigue.   Skin:   Patient denies skin rash/ lesion and itching.   Eyes:   Patient denies  blurred vision and double vision.   Ears/ Nose/ Throat:   Patient denies sore throat and sinus problems.   Hematologic/Lymphatic:   Patient denies swollen glands and easy bruising.   Cardiovascular:   Patient denies leg swelling and chest pains.   Respiratory:   Patient denies cough and shortness of breath.   Endocrine:   Patient denies excessive thirst.   Musculoskeletal:   Patient denies back pain and joint pain.   Neurological:   Patient denies headaches and dizziness.   Psychologic:   Patient denies depression and anxiety.     VITAL SIGNS:       Weight 190 lb / 86.18 kg   Height 71 in / 180.34 cm   BMI 26.5 kg/m     MULTI-SYSTEM PHYSICAL EXAMINATION:     Constitutional: Well-nourished. No physical deformities. Normally developed. Good grooming.                ASSESSMENT:     1 GU:   Prostate Cancer - C61      PLAN:           1. Intermediate risk prostate cancer:  After reviewing treatment options in detail, he is most inclined to consider radiation therapy and specifically does have interest in a radiation seed implantation. He has elected now to proceed with a radiation seed implantation and placement of SpaceOAR.

## 2017-05-30 ENCOUNTER — Telehealth: Payer: Self-pay | Admitting: *Deleted

## 2017-05-30 NOTE — Telephone Encounter (Signed)
CALLED PATIENT TO REMIND OF PROCEDURE FOR 06-02-17, SPOKE WITH PATIENT AND HE IS AWARE OF THIS PROCEDURE

## 2017-06-02 ENCOUNTER — Ambulatory Visit (HOSPITAL_BASED_OUTPATIENT_CLINIC_OR_DEPARTMENT_OTHER)
Admission: RE | Admit: 2017-06-02 | Discharge: 2017-06-02 | Disposition: A | Discharge status: Home / Self Care | Payer: Managed Care, Other (non HMO) | Source: Ambulatory Visit | Attending: Urology | Admitting: Urology

## 2017-06-02 ENCOUNTER — Encounter (HOSPITAL_BASED_OUTPATIENT_CLINIC_OR_DEPARTMENT_OTHER)
Admission: RE | Disposition: A | Discharge status: Home / Self Care | Payer: Self-pay | Source: Ambulatory Visit | Attending: Urology

## 2017-06-02 ENCOUNTER — Encounter (HOSPITAL_BASED_OUTPATIENT_CLINIC_OR_DEPARTMENT_OTHER): Payer: Self-pay

## 2017-06-02 ENCOUNTER — Ambulatory Visit (HOSPITAL_BASED_OUTPATIENT_CLINIC_OR_DEPARTMENT_OTHER): Payer: Managed Care, Other (non HMO) | Admitting: Anesthesiology

## 2017-06-02 ENCOUNTER — Ambulatory Visit (HOSPITAL_COMMUNITY): Payer: Managed Care, Other (non HMO)

## 2017-06-02 DIAGNOSIS — Z7982 Long term (current) use of aspirin: Secondary | ICD-10-CM | POA: Diagnosis not present

## 2017-06-02 DIAGNOSIS — Z902 Acquired absence of lung [part of]: Secondary | ICD-10-CM | POA: Diagnosis not present

## 2017-06-02 DIAGNOSIS — C61 Malignant neoplasm of prostate: Secondary | ICD-10-CM | POA: Insufficient documentation

## 2017-06-02 HISTORY — DX: Gastro-esophageal reflux disease without esophagitis: K21.9

## 2017-06-02 HISTORY — DX: Pneumonia, unspecified organism: J18.9

## 2017-06-02 HISTORY — DX: Unspecified osteoarthritis, unspecified site: M19.90

## 2017-06-02 HISTORY — PX: RADIOACTIVE SEED IMPLANT: SHX5150

## 2017-06-02 HISTORY — PX: SPACE OAR INSTILLATION: SHX6769

## 2017-06-02 HISTORY — PX: CYSTOSCOPY: SHX5120

## 2017-06-02 SURGERY — INSERTION, RADIATION SOURCE, PROSTATE
Anesthesia: General | Site: Rectum

## 2017-06-02 MED ORDER — CIPROFLOXACIN IN D5W 400 MG/200ML IV SOLN
INTRAVENOUS | Status: AC
  Filled 2017-06-02: qty 200

## 2017-06-02 MED ORDER — SULFAMETHOXAZOLE-TRIMETHOPRIM 800-160 MG PO TABS: 1.0000 | ORAL_TABLET | Freq: Two times a day (BID) | ORAL | 0 refills | Status: AC

## 2017-06-02 MED ORDER — ONDANSETRON HCL 4 MG/2ML IJ SOLN
INTRAMUSCULAR | Status: DC | PRN
  Administered 2017-06-02: 4 mg via INTRAVENOUS

## 2017-06-02 MED ORDER — TAMSULOSIN HCL 0.4 MG PO CAPS: 0.4000 mg | ORAL_CAPSULE | Freq: Every day | ORAL | 0 refills | Status: AC

## 2017-06-02 MED ORDER — FENTANYL CITRATE (PF) 100 MCG/2ML IJ SOLN
INTRAMUSCULAR | Status: AC
  Filled 2017-06-02: qty 2

## 2017-06-02 MED ORDER — MEPERIDINE HCL 25 MG/ML IJ SOLN
6.2500 mg | INTRAMUSCULAR | Status: DC | PRN
  Filled 2017-06-02: qty 1

## 2017-06-02 MED ORDER — ONDANSETRON HCL 4 MG/2ML IJ SOLN
INTRAMUSCULAR | Status: AC
  Filled 2017-06-02: qty 2

## 2017-06-02 MED ORDER — PROPOFOL 10 MG/ML IV BOLUS
INTRAVENOUS | Status: DC | PRN
  Administered 2017-06-02: 200 mg via INTRAVENOUS
  Administered 2017-06-02: 20 mg via INTRAVENOUS

## 2017-06-02 MED ORDER — FENTANYL CITRATE (PF) 100 MCG/2ML IJ SOLN
INTRAMUSCULAR | Status: DC | PRN
  Administered 2017-06-02: 25 ug via INTRAVENOUS
  Administered 2017-06-02: 50 ug via INTRAVENOUS
  Administered 2017-06-02: 25 ug via INTRAVENOUS

## 2017-06-02 MED ORDER — DEXAMETHASONE SODIUM PHOSPHATE 10 MG/ML IJ SOLN
INTRAMUSCULAR | Status: AC
  Filled 2017-06-02: qty 1

## 2017-06-02 MED ORDER — METOCLOPRAMIDE HCL 5 MG/ML IJ SOLN
10.0000 mg | Freq: Once | INTRAMUSCULAR | Status: DC | PRN
  Filled 2017-06-02: qty 2

## 2017-06-02 MED ORDER — LIDOCAINE HCL (CARDIAC) 20 MG/ML IV SOLN
INTRAVENOUS | Status: DC | PRN
  Administered 2017-06-02: 100 mg via INTRAVENOUS

## 2017-06-02 MED ORDER — LACTATED RINGERS IV SOLN
INTRAVENOUS | Status: DC
  Filled 2017-06-02: qty 1000

## 2017-06-02 MED ORDER — FENTANYL CITRATE (PF) 100 MCG/2ML IJ SOLN
25.0000 ug | INTRAMUSCULAR | Status: DC | PRN
  Filled 2017-06-02: qty 1

## 2017-06-02 MED ORDER — MIDAZOLAM HCL 2 MG/2ML IJ SOLN
INTRAMUSCULAR | Status: AC
  Filled 2017-06-02: qty 2

## 2017-06-02 MED ORDER — SODIUM CHLORIDE 0.9 % IR SOLN
Status: DC | PRN
  Administered 2017-06-02: 3000 mL

## 2017-06-02 MED ORDER — SODIUM CHLORIDE 0.9 % IJ SOLN
INTRAMUSCULAR | Status: DC | PRN
  Administered 2017-06-02: 10 mL via INTRAVENOUS

## 2017-06-02 MED ORDER — MIDAZOLAM HCL 5 MG/5ML IJ SOLN
INTRAMUSCULAR | Status: DC | PRN
  Administered 2017-06-02: 2 mg via INTRAVENOUS

## 2017-06-02 MED ORDER — DOCUSATE SODIUM 100 MG PO CAPS: 100.0000 mg | ORAL_CAPSULE | Freq: Two times a day (BID) | ORAL | 0 refills | Status: AC

## 2017-06-02 MED ORDER — FLEET ENEMA 7-19 GM/118ML RE ENEM
1.0000 | ENEMA | Freq: Once | RECTAL | Status: AC
  Administered 2017-06-02: 1 via RECTAL
  Filled 2017-06-02: qty 1

## 2017-06-02 MED ORDER — CIPROFLOXACIN IN D5W 400 MG/200ML IV SOLN
400.0000 mg | INTRAVENOUS | Status: AC
  Administered 2017-06-02: 400 mg via INTRAVENOUS
  Filled 2017-06-02: qty 200

## 2017-06-02 MED ORDER — IOHEXOL 300 MG/ML  SOLN
INTRAMUSCULAR | Status: DC | PRN
  Administered 2017-06-02: 7 mL

## 2017-06-02 MED ORDER — LIDOCAINE 2% (20 MG/ML) 5 ML SYRINGE
INTRAMUSCULAR | Status: AC
  Filled 2017-06-02: qty 5

## 2017-06-02 MED ORDER — PROPOFOL 10 MG/ML IV BOLUS
INTRAVENOUS | Status: AC
  Filled 2017-06-02: qty 20

## 2017-06-02 MED ORDER — HYDROCODONE-ACETAMINOPHEN 5-325 MG PO TABS: 1.0000 | ORAL_TABLET | Freq: Four times a day (QID) | ORAL | 0 refills | Status: AC | PRN

## 2017-06-02 MED ORDER — LACTATED RINGERS IV SOLN
INTRAVENOUS | Status: DC
  Administered 2017-06-02 (×2): via INTRAVENOUS
  Filled 2017-06-02: qty 1000

## 2017-06-02 MED ORDER — DEXAMETHASONE SODIUM PHOSPHATE 4 MG/ML IJ SOLN
INTRAMUSCULAR | Status: DC | PRN
  Administered 2017-06-02: 10 mg via INTRAVENOUS

## 2017-06-02 SURGICAL SUPPLY — 40 items
BAG DRAIN URO-CYSTO SKYTR STRL (DRAIN) ×6 IMPLANT
BAG URINE DRAINAGE (UROLOGICAL SUPPLIES) ×6 IMPLANT
BLADE CLIPPER SURG (BLADE) ×6 IMPLANT
CATH FOLEY 2WAY SLVR  5CC 16FR (CATHETERS) ×2
CATH FOLEY 2WAY SLVR 5CC 16FR (CATHETERS) ×4 IMPLANT
CATH ROBINSON RED A/P 16FR (CATHETERS) IMPLANT
CATH ROBINSON RED A/P 20FR (CATHETERS) ×6 IMPLANT
CLOTH BEACON ORANGE TIMEOUT ST (SAFETY) ×6 IMPLANT
COVER BACK TABLE 60X90IN (DRAPES) ×6 IMPLANT
COVER MAYO STAND STRL (DRAPES) ×6 IMPLANT
DRSG TEGADERM 4X4.75 (GAUZE/BANDAGES/DRESSINGS) ×6 IMPLANT
DRSG TEGADERM 8X12 (GAUZE/BANDAGES/DRESSINGS) ×12 IMPLANT
GAUZE SPONGE 4X4 12PLY STRL (GAUZE/BANDAGES/DRESSINGS) ×6 IMPLANT
GLOVE BIO SURGEON STRL SZ 6.5 (GLOVE) ×5 IMPLANT
GLOVE BIO SURGEON STRL SZ7.5 (GLOVE) ×12 IMPLANT
GLOVE BIO SURGEONS STRL SZ 6.5 (GLOVE) ×1
GLOVE BIOGEL PI IND STRL 6.5 (GLOVE) ×8 IMPLANT
GLOVE BIOGEL PI IND STRL 7.5 (GLOVE) ×4 IMPLANT
GLOVE BIOGEL PI INDICATOR 6.5 (GLOVE) ×4
GLOVE BIOGEL PI INDICATOR 7.5 (GLOVE) ×2
GLOVE ECLIPSE 8.0 STRL XLNG CF (GLOVE) ×30 IMPLANT
GOWN STRL REUS W/TWL LRG LVL3 (GOWN DISPOSABLE) ×12 IMPLANT
HOLDER FOLEY CATH W/STRAP (MISCELLANEOUS) IMPLANT
IMPL SPACEOAR SYSTEM 10ML (MISCELLANEOUS) ×4 IMPLANT
IMPLANT SPACEOAR SYSTEM 10ML (MISCELLANEOUS) ×6
IV NS 1000ML (IV SOLUTION) ×2
IV NS 1000ML BAXH (IV SOLUTION) ×4 IMPLANT
KIT TURNOVER CYSTO (KITS) ×6 IMPLANT
MANIFOLD NEPTUNE II (INSTRUMENTS) IMPLANT
NS IRRIG 500ML POUR BTL (IV SOLUTION) IMPLANT
PACK CYSTO (CUSTOM PROCEDURE TRAY) ×6 IMPLANT
SURGILUBE 2OZ TUBE FLIPTOP (MISCELLANEOUS) IMPLANT
SUT BONE WAX W31G (SUTURE) ×6 IMPLANT
SYRINGE 10CC LL (SYRINGE) IMPLANT
TUBE CONNECTING 12'X1/4 (SUCTIONS)
TUBE CONNECTING 12X1/4 (SUCTIONS) IMPLANT
UNDERPAD 30X30 (UNDERPADS AND DIAPERS) ×12 IMPLANT
WATER STERILE IRR 3000ML UROMA (IV SOLUTION) IMPLANT
WATER STERILE IRR 500ML POUR (IV SOLUTION) ×6 IMPLANT
selectseed i-125 ×450 IMPLANT

## 2017-06-02 NOTE — Anesthesia Procedure Notes (Signed)
Procedure Name: LMA Insertion Date/Time: 06/02/2017 1:14 PM Performed by: Montez Hageman, MD Pre-anesthesia Checklist: Patient identified, Emergency Drugs available, Suction available and Patient being monitored Patient Re-evaluated:Patient Re-evaluated prior to induction Oxygen Delivery Method: Circle system utilized Preoxygenation: Pre-oxygenation with 100% oxygen Induction Type: IV induction Ventilation: Mask ventilation without difficulty LMA: LMA inserted LMA Size: 4.0 Number of attempts: 1 Airway Equipment and Method: Bite block Placement Confirmation: positive ETCO2 Tube secured with: Tape Dental Injury: Teeth and Oropharynx as per pre-operative assessment

## 2017-06-02 NOTE — Discharge Instructions (Addendum)
°  You will be prescribed tamsulosin which is a medication to help you urinate over the next month.  You should call Dr. Lynne Logan office [phone]) if you feel you cannot empty your bladder well. Also, call if you develop fever > 101.  You will also be prescribed pain medication, a stool softener, and an antibiotic to take initially after the procedure.  Followup with [name] and your radiation oncologist as scheduled.   Post Anesthesia Home Care Instructions  Activity: Get plenty of rest for the remainder of the day. A responsible individual must stay with you for 24 hours following the procedure.  For the next 24 hours, DO NOT: -Drive a car -Paediatric nurse -Drink alcoholic beverages -Take any medication unless instructed by your physician -Make any legal decisions or sign important papers.  Meals: Start with liquid foods such as gelatin or soup. Progress to regular foods as tolerated. Avoid greasy, spicy, heavy foods. If nausea and/or vomiting occur, drink only clear liquids until the nausea and/or vomiting subsides. Call your physician if vomiting continues.  Special Instructions/Symptoms: Your throat may feel dry or sore from the anesthesia or the breathing tube placed in your throat during surgery. If this causes discomfort, gargle with warm salt water. The discomfort should disappear within 24 hours.  If you had a scopolamine patch placed behind your ear for the management of post- operative nausea and/or vomiting:  1. The medication in the patch is effective for 72 hours, after which it should be removed.  Wrap patch in a tissue and discard in the trash. Wash hands thoroughly with soap and water. 2. You may remove the patch earlier than 72 hours if you experience unpleasant side effects which may include dry mouth, dizziness or visual disturbances. 3. Avoid touching the patch. Wash your hands with soap and water after contact with the patch.

## 2017-06-02 NOTE — Anesthesia Preprocedure Evaluation (Signed)
Anesthesia Evaluation  Patient identified by MRN, date of birth, ID band Patient awake    Reviewed: Allergy & Precautions, NPO status , Patient's Chart, lab work & pertinent test results  Airway Mallampati: II  TM Distance: >3 FB Neck ROM: Full    Dental no notable dental hx.    Pulmonary neg pneumonia , former smoker,    Pulmonary exam normal breath sounds clear to auscultation       Cardiovascular negative cardio ROS Normal cardiovascular exam Rhythm:Regular Rate:Normal     Neuro/Psych negative neurological ROS  negative psych ROS   GI/Hepatic GERD  Controlled,(+)     substance abuse (h/o EtOH abuse. Still drinking daily)  alcohol use,   Endo/Other  negative endocrine ROS  Renal/GU negative Renal ROS  negative genitourinary   Musculoskeletal negative musculoskeletal ROS (+)   Abdominal   Peds negative pediatric ROS (+)  Hematology negative hematology ROS (+)   Anesthesia Other Findings   Reproductive/Obstetrics negative OB ROS                             Anesthesia Physical Anesthesia Plan  ASA: II  Anesthesia Plan: General   Post-op Pain Management:    Induction: Intravenous  PONV Risk Score and Plan: 2 and Ondansetron and Treatment may vary due to age or medical condition  Airway Management Planned: LMA and Oral ETT  Additional Equipment:   Intra-op Plan:   Post-operative Plan: Extubation in OR  Informed Consent: I have reviewed the patients History and Physical, chart, labs and discussed the procedure including the risks, benefits and alternatives for the proposed anesthesia with the patient or authorized representative who has indicated his/her understanding and acceptance.   Dental advisory given  Plan Discussed with: CRNA  Anesthesia Plan Comments:        Anesthesia Quick Evaluation

## 2017-06-02 NOTE — Op Note (Signed)
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 M.D.  Radiation oncologist: [name], M.D.  Anesthesia: General  EBL: Minimal  Complications: None  Indication: Derek Russell is a 62 y.o. gentleman with clinically localized prostate cancer. After discussing management options for treatment, he elected to proceed with radiotherapy. He presents today for the above procedures. The potential risks, complications, alternative options, and expected recovery course have been discussed in detail with the patient and he has provided informed consent to proceed.  Description of procedure: The patient was taken to the operating room and general anesthesia was induced. He was administered preoperative antibiotics, placed in the dorsal lithotomy position, and prepped and draped in the usual sterile fashion. Next, intraoperative transrectal ultrasonography was utilized for real-time intraoperative planning by the radiation oncology team. Once the treatment plan was completed, radiation seeds were placed utilizing a brachytherapy perineal template and the robotic Nucletron was utilized to place 75 radioactive iodine 125 seeds into the prostate through 22 catheter needles.  The brachytherapy template was then removed.  A site in the midline was selected on the perineum for placement of an 18 g needle with saline.  The needle was advanced above the rectum and below Denonvillier's fascia to the mid gland and confirmed to be in the midline on transverse imaging.  One cc of saline was injected confirming appropriate expansion of this space.  A total of 5 cc of saline was then injected to open the  space further bilaterally.  The saline syringe was then removed and the SpaceOAR hydrogel was injected with good distribution bilaterally. Position of the radiation seeds was confirmed on fluoroscopic imaging.  Flexible cystoscopy was then performed and no seeds were identified within the bladder.  No bladder tumors, stones, or other mucosal pathology was identified within the bladder. He tolerated the procedure well and without complications. He was able to be transferred to the recovery unit in satisfactory condition.  He was given a voiding trial in the PACU.

## 2017-06-02 NOTE — Transfer of Care (Signed)
  Last Vitals:  Vitals:   06/02/17 1025 06/02/17 1446  BP: (!) 149/87 (!) 145/93  Pulse: 89 73  Resp: 18 12  Temp: 37 C 36.5 C  SpO2: 100% 100%    Last Pain:  Vitals:   06/02/17 1025  TempSrc: Oral      Patients Stated Pain Goal: 7 (06/02/17 1051)  Immediate Anesthesia Transfer of Care Note  Patient: Derek Russell  Procedure(s) Performed: Procedure(s) (LRB): RADIOACTIVE SEED IMPLANT/BRACHYTHERAPY IMPLANT (N/A) SPACE OAR INSTILLATION (N/A) CYSTOSCOPY FLEXIBLE  Patient Location: PACU  Anesthesia Type: General  Level of Consciousness: awake, alert  and oriented  Airway & Oxygen Therapy: Patient Spontanous Breathing and Patient connected to nasal cannula oxygen  Post-op Assessment: Report given to PACU RN and Post -op Vital signs reviewed and stable  Post vital signs: Reviewed and stable  Complications: No apparent anesthesia complications

## 2017-06-03 ENCOUNTER — Encounter (HOSPITAL_BASED_OUTPATIENT_CLINIC_OR_DEPARTMENT_OTHER): Payer: Self-pay | Admitting: Urology

## 2017-06-03 NOTE — Anesthesia Postprocedure Evaluation (Signed)
Anesthesia Post Note  Patient: Derek Russell  Procedure(s) Performed: RADIOACTIVE SEED IMPLANT/BRACHYTHERAPY IMPLANT (N/A Prostate) SPACE OAR INSTILLATION (N/A Rectum) CYSTOSCOPY FLEXIBLE (Bladder)     Patient location during evaluation: PACU Anesthesia Type: General Level of consciousness: awake and alert Pain management: pain level controlled Vital Signs Assessment: post-procedure vital signs reviewed and stable Respiratory status: spontaneous breathing, nonlabored ventilation, respiratory function stable and patient connected to nasal cannula oxygen Cardiovascular status: blood pressure returned to baseline and stable Postop Assessment: no apparent nausea or vomiting Anesthetic complications: no    Last Vitals:  Vitals:   06/02/17 1515 06/02/17 1630  BP: 135/85 123/76  Pulse: 77 73  Resp: 18 14  Temp:  37.3 C  SpO2: 100% 97%    Last Pain:  Vitals:   06/02/17 1025  TempSrc: Oral                 Martrell Eguia S

## 2017-06-04 NOTE — Progress Notes (Signed)
  Radiation Oncology         [phone] ________________________________  Name: Derek Russell MRN: [id number]  Date: 06/04/2017  DOB: 1955-05-04       Prostate Seed Implant  OM:VEHMCNOB, [place], DO  No ref. provider found  DIAGNOSIS: 62 y.o. gentleman with Stage T2a adenocarcinoma of the prostate with Gleason Score of 3+4 and PSA of 8.38  PROCEDURE: Insertion of radioactive I-125 seeds into the prostate gland.  RADIATION DOSE: 145 Gy, definitive/boost therapy.  TECHNIQUE: Derek Russell was brought to the operating room with the urologist. He was placed in the dorsolithotomy position. He was catheterized and a rectal tube was inserted. The perineum was shaved, prepped and draped. The ultrasound probe was then introduced into the rectum to see the prostate gland.  TREATMENT DEVICE: A needle grid was attached to the ultrasound probe stand and anchor needles were placed.  3D PLANNING: The prostate was imaged in 3D using a sagittal sweep of the prostate probe. These images were transferred to the planning computer. There, the prostate, urethra and rectum were defined on each axial reconstructed image. Then, the software created an optimized 3D plan and a few seed positions were adjusted. The quality of the plan was reviewed using [facility] information for the target and the following two organs at risk:  Urethra and Rectum.  Then the accepted plan was uploaded to the seed Selectron afterloading unit.  PROSTATE VOLUME STUDY:  Using transrectal ultrasound the volume of the prostate was verified to be 47.2 cc.  SPECIAL TREATMENT PROCEDURE/SUPERVISION AND HANDLING: The Nucletron FIRST system was used to place the needles under sagittal guidance. A total of 22 needles were used to deposit 75 seeds in the prostate gland. The individual seed activity was 0.48 mCi.  SpaceOAR:  Yes  COMPLEX SIMULATION: At the end of the procedure, an anterior radiograph of the pelvis was obtained to document seed positioning  and count. Cystoscopy was performed to check the urethra and bladder.  MICRODOSIMETRY: At the end of the procedure, the patient was emitting 0.12 mR/hr at 1 meter. Accordingly, he was considered safe for hospital discharge.  PLAN: The patient will return to the radiation oncology clinic for post implant CT dosimetry in three weeks.   ________________________________  Sheral Apley Tammi Klippel, M.D.

## 2017-06-10 ENCOUNTER — Telehealth: Payer: Self-pay | Admitting: *Deleted

## 2017-06-10 NOTE — Telephone Encounter (Signed)
CALLED PATIENT TO REMIND OF POST SEED APPTS. FOR 06-11-17, SPOKE WITH PATIENT AND HE IS AWARE OF THESE APPTS

## 2017-06-10 NOTE — Telephone Encounter (Signed)
CALLED PATIENT TO INFORM OF MRI FOR 06-13-17- ARRIVAL TIME - 11:45 AM @ [facility], NO RESTRICTIONS TO TEST, SPOKE WITH PATIENT AND HE IS AWARE OF THIS TEST

## 2017-06-11 ENCOUNTER — Encounter: Payer: Self-pay | Admitting: Medical Oncology

## 2017-06-11 ENCOUNTER — Other Ambulatory Visit: Payer: Self-pay

## 2017-06-11 ENCOUNTER — Ambulatory Visit: Payer: Managed Care, Other (non HMO) | Admitting: Radiation Oncology

## 2017-06-11 ENCOUNTER — Encounter: Payer: Self-pay | Admitting: Radiation Oncology

## 2017-06-11 ENCOUNTER — Ambulatory Visit
Admission: RE | Admit: 2017-06-11 | Discharge: 2017-06-11 | Disposition: A | Discharge status: Home / Self Care | Payer: Managed Care, Other (non HMO) | Source: Ambulatory Visit | Attending: Radiation Oncology | Admitting: Radiation Oncology

## 2017-06-11 ENCOUNTER — Ambulatory Visit
Admission: RE | Admit: 2017-06-11 | Discharge: 2017-06-11 | Disposition: A | Discharge status: Home / Self Care | Payer: Managed Care, Other (non HMO) | Source: Ambulatory Visit | Attending: Urology | Admitting: Urology

## 2017-06-11 VITALS — BP 138/87 | HR 80 | Resp 16

## 2017-06-11 DIAGNOSIS — R3911 Hesitancy of micturition: Secondary | ICD-10-CM | POA: Diagnosis not present

## 2017-06-11 DIAGNOSIS — Z79899 Other long term (current) drug therapy: Secondary | ICD-10-CM | POA: Insufficient documentation

## 2017-06-11 DIAGNOSIS — C61 Malignant neoplasm of prostate: Secondary | ICD-10-CM

## 2017-06-11 DIAGNOSIS — Y842 Radiological procedure and radiotherapy as the cause of abnormal reaction of the patient, or of later complication, without mention of misadventure at the time of the procedure: Secondary | ICD-10-CM | POA: Diagnosis not present

## 2017-06-11 DIAGNOSIS — R350 Frequency of micturition: Secondary | ICD-10-CM | POA: Insufficient documentation

## 2017-06-11 DIAGNOSIS — Z510 Encounter for antineoplastic radiation therapy: Secondary | ICD-10-CM | POA: Insufficient documentation

## 2017-06-11 NOTE — Progress Notes (Signed)
  Radiation Oncology         [phone] ________________________________  Name: Derek Russell MRN: [id number]  Date: 06/11/2017  DOB: 24-Jun-1955  COMPLEX SIMULATION NOTE  NARRATIVE:  The patient was brought to the [facility] today following prostate seed implantation approximately one month ago.  Identity was confirmed.  All relevant records and images related to the planned course of therapy were reviewed.  Then, the patient was set-up supine.  CT images were obtained.  The CT images were loaded into the planning software.  Then the prostate and rectum were contoured.  Treatment planning then occurred.  The implanted iodine 125 seeds were identified by the physics staff for projection of radiation distribution  I have requested : 3D Simulation  I have requested a DVH of the following structures: Prostate and rectum.    ________________________________  Sheral Apley Tammi Klippel, M.D.  This document serves as a record of services personally performed by [name], MD. It was created on his behalf by [name], a trained medical scribe. The creation of this record is based on the scribe's personal observations and the provider's statements to them. This document has been checked and approved by the attending provider.

## 2017-06-11 NOTE — Progress Notes (Signed)
Patient returns for post seed follow up appointment. Vitals stable. Denies pain. Pre seed IPSS 5. Post seed IPSS 6. Denies dysuria, hematuria, leakage or incontinence. Scheduled for MRI to confirm SpaceOar placement on 06/13/2017 at 1145. Patient scheduled to follow up with his urologist in the next few weeks.   BP 138/87 (BP Location: Right Arm, Patient Position: Sitting, Cuff Size: Normal)   Pulse 80   Resp 16   SpO2 100%

## 2017-06-11 NOTE — Progress Notes (Signed)
Radiation Oncology         [phone]) [phone] ________________________________  Name: Derek Russell MRN: [id number]  Date: 06/11/2017  DOB: Jul 16, 1955  Post-Seed Follow-Up Visit Note  CC: Derek Nutting, DO  Derek Bring, MD  Diagnosis:   62 y.o. gentleman with Stage T2a adenocarcinoma of the prostate with Gleason Score of 3+4 and PSA of8.38  No diagnosis found.  Interval Since Last Radiation:  1 week - Insertion of radioactive I-125 seeds into the prostate gland, 145 Gy definitive/boost therapy, with SpaceOAR  Narrative:  The patient returns today for routine follow-up.  He is complaining of increased urinary frequency and urinary hesitation symptoms. He filled out a questionnaire regarding urinary function today providing and overall IPSS score of 6 characterizing his symptoms as mild.  His pre-implant score was 5. He denies dysuria, gross hematuria, increased frequency, urgency, incomplete emptying or incontinence.  He feels he is emptying his bladder well on voiding and reports nocturia x1/night.  He denies abdominal pain, nausea, vomiting or diarrhea.  He has a follow-up appointment with Dr. Alinda Russell in April 2019.  ALLERGIES:  has No Known Allergies.  Meds: Current Outpatient Medications  Medication Sig Dispense Refill  . B Complex-C (B-COMPLEX WITH VITAMIN C) tablet Take 1 tablet by mouth every morning.     . docusate sodium (COLACE) 100 MG capsule Take 1 capsule (100 mg total) by mouth 2 (two) times daily. 30 capsule 0  . HYDROcodone-acetaminophen (NORCO/VICODIN) 5-325 MG tablet Take 1-2 tablets by mouth every 6 (six) hours as needed. 8 tablet 0  . sulfamethoxazole-trimethoprim (BACTRIM DS,SEPTRA DS) 800-160 MG tablet Take 1 tablet by mouth 2 (two) times daily. 4 tablet 0  . tamsulosin (FLOMAX) 0.4 MG CAPS capsule Take 1 capsule (0.4 mg total) by mouth at bedtime. 30 capsule 0   No current facility-administered medications for this encounter.     Physical Findings: In general  this is a well appearing Caucasian male in no acute distress. He's alert and oriented x4 and appropriate throughout the examination. Cardiopulmonary assessment is negative for acute distress and he exhibits normal effort.   Lab Findings: Lab Results  Component Value Date   WBC 4.7 05/26/2017   HGB 14.9 05/26/2017   HCT 43.5 05/26/2017   MCV 91.8 05/26/2017   PLT 235 05/26/2017    Radiographic Findings:  Patient underwent CT imaging in our clinic for post implant dosimetry. The CT was reviewed by Dr. Tammi Russell and appears to demonstrate an adequate distribution of radioactive seeds throughout the prostate gland. There are no seeds in or near the rectum.  He is scheduled for an MRI of the prostate on Friday, June 13, 2017 and these images will be fused with his CT scan for further evaluation.  We suspect the final radiation plan and dosimetry will show appropriate coverage of the prostate gland.   Impression/Plan: The patient is recovering from the effects of radiation. His urinary symptoms should gradually improve over the next 4-6 months. We talked about this today. He is encouraged by his improvement already and is otherwise pleased with his outcome. We also talked about long-term follow-up for prostate cancer following seed implant. He understands that ongoing PSA determinations and digital rectal exams will help perform surveillance to rule out disease recurrence. He has a follow up appointment scheduled with Dr. Alinda Russell in April 2019. He understands what to expect with his PSA measures. Patient was also educated today about some of the long-term effects from radiation including a small risk for rectal  bleeding and possibly erectile dysfunction. We talked about some of the general management approaches to these potential complications. However, I did encourage the patient to contact our office or return at any point if he has questions or concerns related to his previous radiation and prostate  cancer.    [name], PA-C    [name], MD  [facility] Direct Dial: [phone]  Fax: [phone] [email or web address]  Skype  [email or web address]    Page Me   This document serves as a record of services personally performed by [name], MD and [name], PA-C. It was created on their behalf by [name], a trained medical scribe. The creation of this record is based on the scribe's personal observations and the providers' statements to them. This document has been checked and approved by the attending providers.

## 2017-06-13 ENCOUNTER — Ambulatory Visit (HOSPITAL_COMMUNITY): Admission: RE | Admit: 2017-06-13 | Payer: Managed Care, Other (non HMO) | Source: Ambulatory Visit

## 2017-06-14 ENCOUNTER — Ambulatory Visit (HOSPITAL_COMMUNITY)
Admission: RE | Admit: 2017-06-14 | Discharge: 2017-06-14 | Disposition: A | Discharge status: Home / Self Care | Payer: Managed Care, Other (non HMO) | Source: Ambulatory Visit | Attending: Urology | Admitting: Urology

## 2017-06-14 DIAGNOSIS — C61 Malignant neoplasm of prostate: Secondary | ICD-10-CM | POA: Diagnosis not present

## 2017-07-17 ENCOUNTER — Encounter: Payer: Self-pay | Admitting: Radiation Oncology

## 2017-07-17 ENCOUNTER — Ambulatory Visit: Payer: Managed Care, Other (non HMO) | Attending: Radiation Oncology | Admitting: Radiation Oncology

## 2017-07-17 DIAGNOSIS — Z510 Encounter for antineoplastic radiation therapy: Secondary | ICD-10-CM | POA: Insufficient documentation

## 2017-07-17 DIAGNOSIS — C61 Malignant neoplasm of prostate: Secondary | ICD-10-CM | POA: Diagnosis not present

## 2017-07-20 NOTE — Progress Notes (Signed)
  Radiation Oncology         [phone] ________________________________  Name: Derek Russell MRN: [id number]  Date: 07/17/2017  DOB: 1955-05-24  3D Planning Note   Prostate Brachytherapy Post-Implant Dosimetry  Diagnosis: 62 y.o. gentleman with Stage T2a adenocarcinoma of the prostate with Gleason Score of 3+4 and PSA of 8.38  Narrative: On a previous date, Derek Russell returned following prostate seed implantation for post implant planning. He underwent CT scan complex simulation to delineate the three-dimensional structures of the pelvis and demonstrate the radiation distribution.  Since that time, the seed localization, and complex isodose planning with dose volume histograms have now been completed.  Results:   Prostate Coverage - The dose of radiation delivered to the 90% or more of the prostate gland (D90) was 120.42% of the prescription dose. This exceeds our goal of greater than 90%. Rectal Sparing - The volume of rectal tissue receiving the prescription dose or higher was 0.0 cc. This falls under our thresholds tolerance of 1.0 cc.  Impression: The prostate seed implant appears to show adequate target coverage and appropriate rectal sparing.  Plan:  The patient will continue to follow with urology for ongoing PSA determinations. I would anticipate a high likelihood for local tumor control with minimal risk for rectal morbidity.  ________________________________  Sheral Apley Tammi Klippel, M.D.

## 2017-08-06 ENCOUNTER — Telehealth: Payer: Self-pay | Admitting: Radiation Oncology

## 2017-08-06 NOTE — Telephone Encounter (Signed)
Received voicemail message from patient inquiring about restrictions s/p radioactive seed implant. Patient wants to go to the beach with his family on July 4th and there will be several small children there. Explained that "children should not be allowed to sit on his lap or otherwise be in very close contact for more than a few minutes for the first 16 weeks following his implant. Explained that sixteen weeks from his implant on March 6th will be June 26th thus, a July 4th trip would not require any restrictions. Patient verbalized understanding and expressed appreciation for the return call.

## 2017-09-09 NOTE — Addendum Note (Signed)
Encounter addended by: Heywood Footman, RN on: 09/09/2017 10:16 AM  Actions taken: Flowsheet accepted

## 2018-08-17 IMAGING — CR DG CHEST 2V
2 series · 2 of 2 positions shown · non-contrast
Comparison: 10/10/2048

CLINICAL DATA: Preoperative evaluation for upcoming prostate
surgeries

EXAM:
CHEST  2 VIEW

[w chest pa]
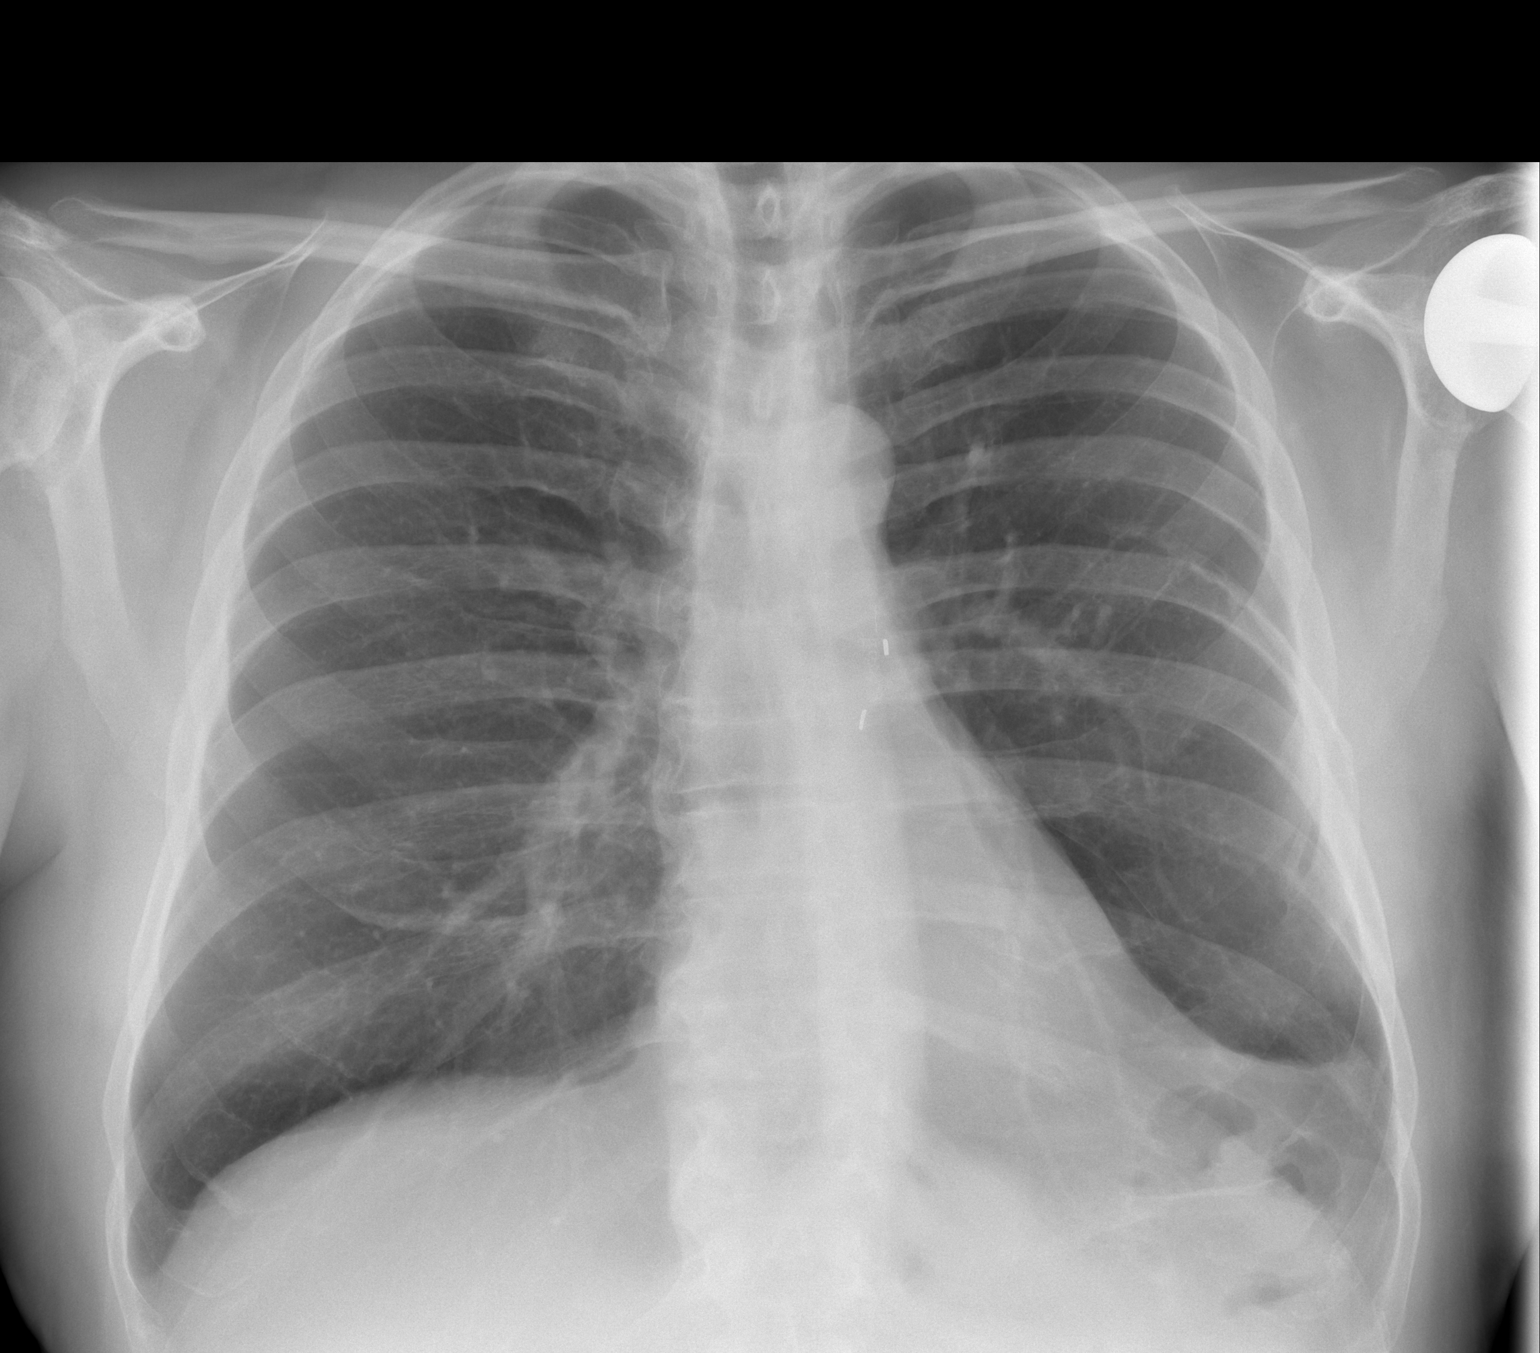

[w chest lat]
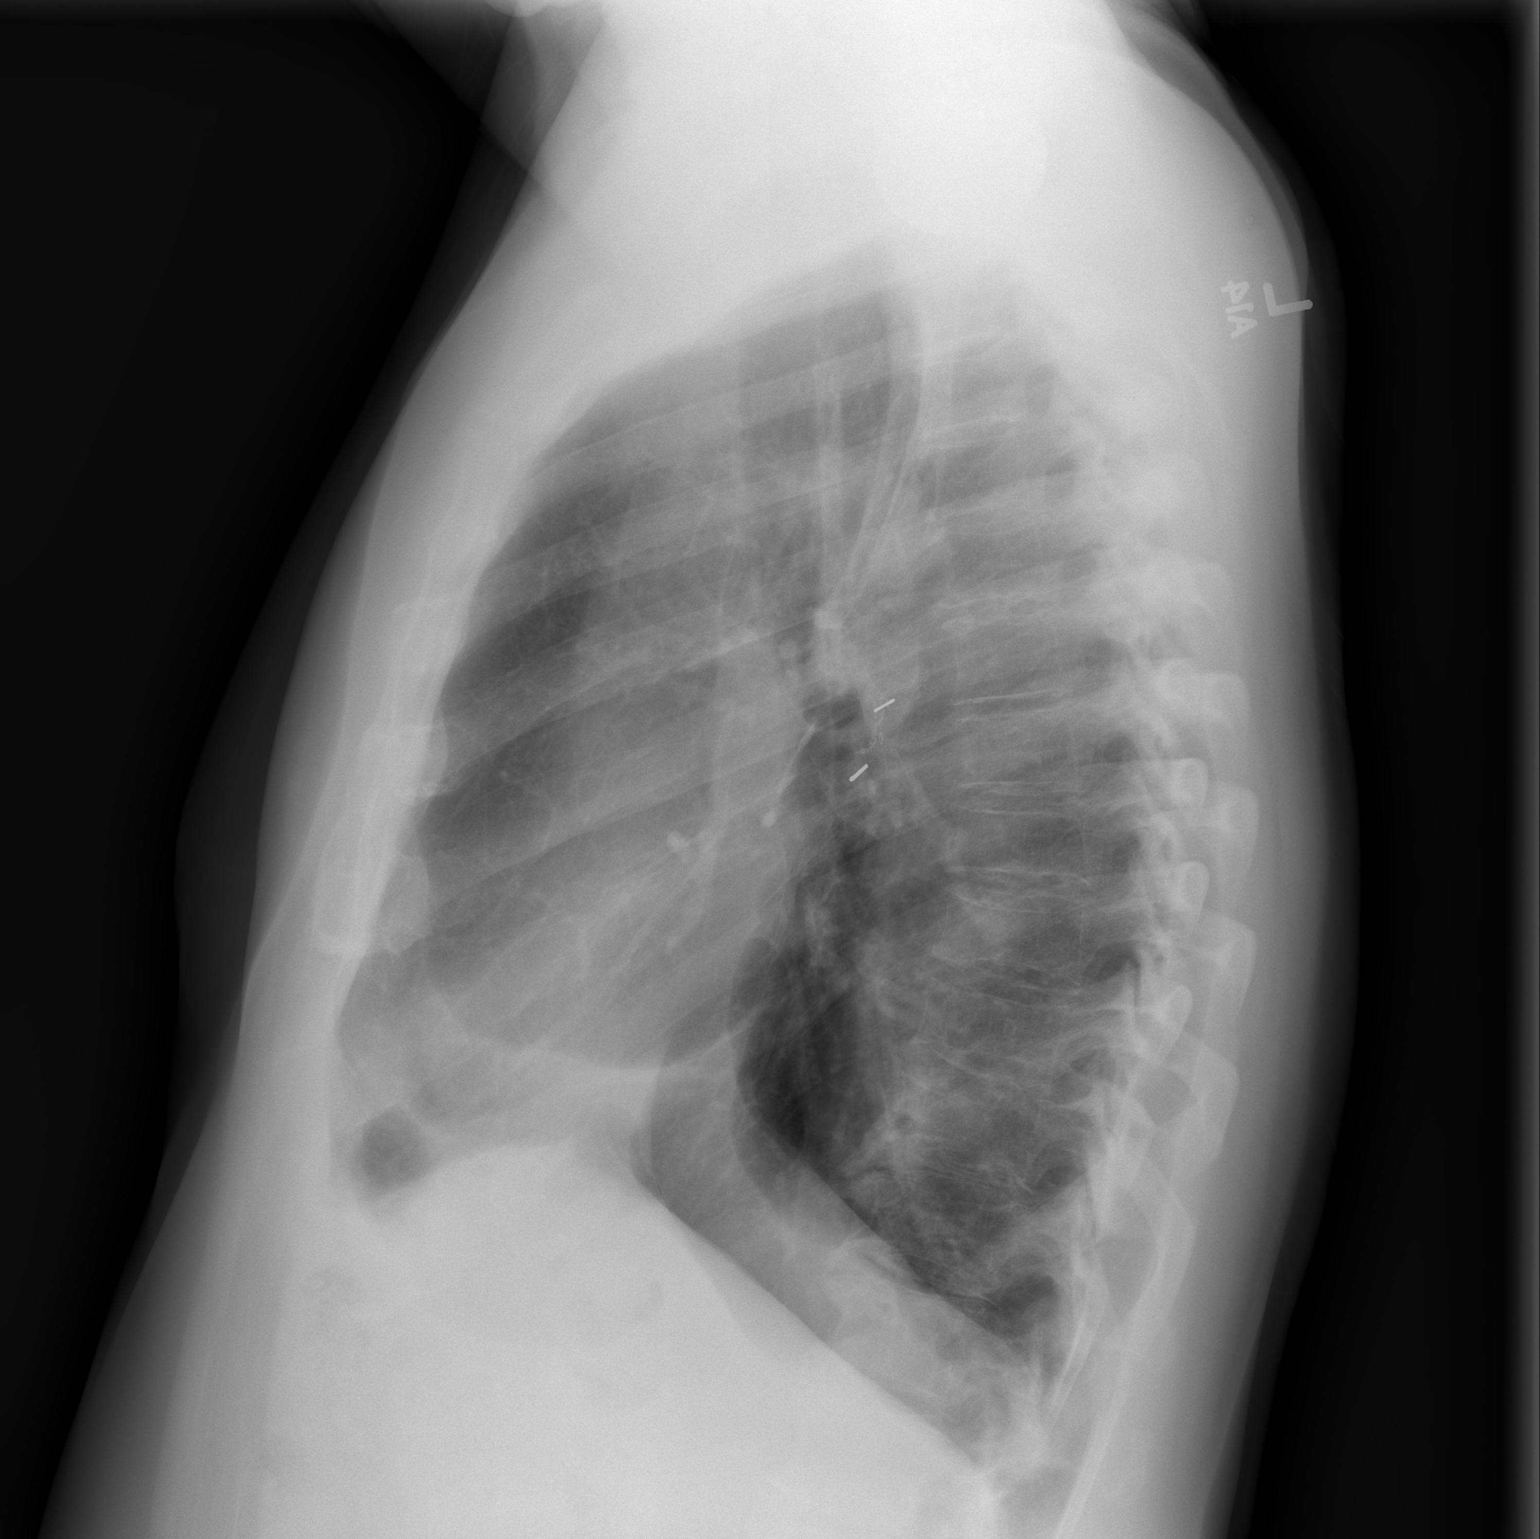

[2 of 2 positions shown; findings below may reference images not displayed]

FINDINGS: Cardiac shadow is within normal limits. The lungs are well aerated
bilaterally. Chronic changes are noted on the left consistent with
prior partial pneumonectomy. No acute infiltrate or sizable effusion
is seen. No acute bony abnormality is noted.
IMPRESSION: No acute abnormality noted.
